# Patient Record
Sex: Female | Born: 1985 | Race: White | Hispanic: No | Marital: Married | Smoking: Never smoker
Health system: Southern US, Community
[De-identification: ages and names within clinical notes are randomized; demographics above are authoritative.]

## PROBLEM LIST (undated history)

## (undated) DIAGNOSIS — Z803 Family history of malignant neoplasm of breast: Secondary | ICD-10-CM

## (undated) DIAGNOSIS — G43909 Migraine, unspecified, not intractable, without status migrainosus: Secondary | ICD-10-CM

## (undated) DIAGNOSIS — R87629 Unspecified abnormal cytological findings in specimens from vagina: Secondary | ICD-10-CM

## (undated) HISTORY — DX: Unspecified abnormal cytological findings in specimens from vagina: R87.629

## (undated) HISTORY — DX: Migraine, unspecified, not intractable, without status migrainosus: G43.909

## (undated) HISTORY — DX: Family history of malignant neoplasm of breast: Z80.3

---

## 2009-10-04 ENCOUNTER — Ambulatory Visit: Payer: Self-pay | Admitting: Internal Medicine

## 2011-07-31 DIAGNOSIS — R87629 Unspecified abnormal cytological findings in specimens from vagina: Secondary | ICD-10-CM

## 2011-07-31 HISTORY — DX: Unspecified abnormal cytological findings in specimens from vagina: R87.629

## 2012-05-19 ENCOUNTER — Ambulatory Visit: Payer: Self-pay

## 2012-05-19 LAB — URINALYSIS, COMPLETE

## 2012-05-21 LAB — URINE CULTURE

## 2013-08-08 ENCOUNTER — Ambulatory Visit: Payer: Self-pay | Admitting: Family Medicine

## 2013-08-08 ENCOUNTER — Emergency Department: Payer: Self-pay | Admitting: Emergency Medicine

## 2013-08-08 LAB — CBC WITH DIFFERENTIAL/PLATELET
BASOS ABS: 0 10*3/uL (ref 0.0–0.1)
Basophil %: 0.5 %
EOS PCT: 1.1 %
Eosinophil #: 0.1 10*3/uL (ref 0.0–0.7)
HCT: 38.2 % (ref 35.0–47.0)
HGB: 13.3 g/dL (ref 12.0–16.0)
LYMPHS ABS: 1.4 10*3/uL (ref 1.0–3.6)
Lymphocyte %: 17.1 %
MCH: 29.5 pg (ref 26.0–34.0)
MCHC: 34.7 g/dL (ref 32.0–36.0)
MCV: 85 fL (ref 80–100)
MONOS PCT: 7.1 %
Monocyte #: 0.6 x10 3/mm (ref 0.2–0.9)
NEUTROS PCT: 74.2 %
Neutrophil #: 5.9 10*3/uL (ref 1.4–6.5)
PLATELETS: 174 10*3/uL (ref 150–440)
RBC: 4.5 10*6/uL (ref 3.80–5.20)
RDW: 13.9 % (ref 11.5–14.5)
WBC: 8 10*3/uL (ref 3.6–11.0)

## 2013-08-08 LAB — COMPREHENSIVE METABOLIC PANEL
ALBUMIN: 3.6 g/dL (ref 3.4–5.0)
ALK PHOS: 46 U/L
ALT: 17 U/L (ref 12–78)
Anion Gap: 4 — ABNORMAL LOW (ref 7–16)
BUN: 8 mg/dL (ref 7–18)
Bilirubin,Total: 1.1 mg/dL — ABNORMAL HIGH (ref 0.2–1.0)
Calcium, Total: 8.9 mg/dL (ref 8.5–10.1)
Chloride: 104 mmol/L (ref 98–107)
Co2: 26 mmol/L (ref 21–32)
Creatinine: 0.7 mg/dL (ref 0.60–1.30)
EGFR (African American): >60
GLUCOSE: 75 mg/dL (ref 65–99)
Osmolality: 265 (ref 275–301)
Potassium: 3.6 mmol/L (ref 3.5–5.1)
SGOT(AST): 26 U/L (ref 15–37)
Sodium: 134 mmol/L — ABNORMAL LOW (ref 136–145)
TOTAL PROTEIN: 7.5 g/dL (ref 6.4–8.2)

## 2013-08-08 LAB — URINALYSIS, COMPLETE
Bacteria: NEGATIVE
Blood: NEGATIVE
Glucose,UR: NEGATIVE mg/dL (ref 0–75)
Ketone: NEGATIVE
Leukocyte Esterase: NEGATIVE
Nitrite: NEGATIVE
PH: 6 (ref 4.5–8.0)
Specific Gravity: >=1.03 (ref 1.003–1.030)

## 2013-08-08 LAB — RAPID INFLUENZA A&B ANTIGENS

## 2013-08-10 LAB — URINE CULTURE

## 2013-09-26 ENCOUNTER — Ambulatory Visit: Payer: Self-pay | Admitting: Physician Assistant

## 2014-02-21 ENCOUNTER — Inpatient Hospital Stay: Payer: Self-pay

## 2014-02-21 LAB — CBC WITH DIFFERENTIAL/PLATELET
BASOS PCT: 1.1 %
Basophil #: 0.2 10*3/uL — ABNORMAL HIGH (ref 0.0–0.1)
EOS PCT: 0.9 %
Eosinophil #: 0.1 10*3/uL (ref 0.0–0.7)
HCT: 32.9 % — ABNORMAL LOW (ref 35.0–47.0)
HGB: 10.4 g/dL — ABNORMAL LOW (ref 12.0–16.0)
LYMPHS PCT: 12.1 %
Lymphocyte #: 1.7 10*3/uL (ref 1.0–3.6)
MCH: 25.4 pg — AB (ref 26.0–34.0)
MCHC: 31.8 g/dL — ABNORMAL LOW (ref 32.0–36.0)
MCV: 80 fL (ref 80–100)
MONO ABS: 0.9 x10 3/mm (ref 0.2–0.9)
MONOS PCT: 6.6 %
NEUTROS ABS: 11.1 10*3/uL — AB (ref 1.4–6.5)
Neutrophil %: 79.3 %
Platelet: 193 10*3/uL (ref 150–440)
RBC: 4.12 10*6/uL (ref 3.80–5.20)
RDW: 14.3 % (ref 11.5–14.5)
WBC: 14 10*3/uL — AB (ref 3.6–11.0)

## 2014-02-22 LAB — HEMATOCRIT: HCT: 32.5 % — ABNORMAL LOW (ref 35.0–47.0)

## 2014-08-11 LAB — HM PAP SMEAR

## 2014-09-03 HISTORY — PX: COLPOSCOPY: SHX161

## 2014-12-07 NOTE — H&P (Signed)
L&D Evaluation:  History:  HPI 29yo MWF poresents in active labor at 725w0d, with regular contractions and BOW intact. Denies concerns. PNC w/o complications.   Presents with contractions   Patient's Medical History No Chronic Illness   Patient's Surgical History none   Medications Pre Natal Vitamins  Iron  Tylenol (Acetaminophen)   Allergies NKDA   Social History none   Family History Non-Contributory   ROS:  ROS All systems were reviewed.  HEENT, CNS, GI, GU, Respiratory, CV, Renal and Musculoskeletal systems were found to be normal.   Exam:  Vital Signs stable   Urine Protein not completed   General no apparent distress   Mental Status clear   Chest clear   Heart normal sinus rhythm   Abdomen gravid, tender with contractions   Estimated Fetal Weight Average for gestational age   Fetal Position vtx   Edema no edema   Pelvic 7cm per RN exam   Mebranes Intact   FHT normal rate with no decels   Fetal Heart Rate 150   Ucx regular   Ucx Frequency 3 min   Length of each Contraction 60 seconds   Ucx Pain Scale 8   Impression:  Impression active labor   Plan:  Plan monitor contractions and for cervical change, epidural   Electronic Signatures: Ulyses AmorBurr, Arelyn Gauer N (CNM)  (Signed 26-Jul-15 17:13)  Authored: L&D Evaluation   Last Updated: 26-Jul-15 17:13 by Ulyses AmorBurr, Joyceann Kruser N (CNM)

## 2015-02-28 ENCOUNTER — Encounter: Payer: Self-pay | Admitting: *Deleted

## 2015-03-04 ENCOUNTER — Encounter: Payer: Self-pay | Admitting: Obstetrics and Gynecology

## 2015-03-04 ENCOUNTER — Ambulatory Visit (INDEPENDENT_AMBULATORY_CARE_PROVIDER_SITE_OTHER): Payer: BLUE CROSS/BLUE SHIELD | Admitting: Obstetrics and Gynecology

## 2015-03-04 VITALS — BP 113/84 | HR 99 | Ht 66.0 in | Wt 144.6 lb

## 2015-03-04 DIAGNOSIS — R896 Abnormal cytological findings in specimens from other organs, systems and tissues: Secondary | ICD-10-CM

## 2015-03-04 DIAGNOSIS — IMO0002 Reserved for concepts with insufficient information to code with codable children: Secondary | ICD-10-CM

## 2015-03-04 NOTE — Progress Notes (Signed)
Patient ID: LUDWIKA RODD, female   DOB: 08-Jun-1986, 29 y.o.   MRN: [id number]  Here for repeat pap only- denies any changes or concerns.  Previous pap in Jan 2016 showed LGSIL with colpo results the same.  O: A&O x4 Pelvic exam: normal external genitalia, vulva, vagina, cervix, uterus and adnexa  A Previous LGSIL on pap.  P: pap obtained.  RTC 6 months or PRN.  Quaniyah Bugh Ines Bloomer, CNM

## 2015-03-11 ENCOUNTER — Telehealth: Payer: Self-pay | Admitting: *Deleted

## 2015-03-11 ENCOUNTER — Encounter: Payer: Self-pay | Admitting: Obstetrics and Gynecology

## 2015-03-11 NOTE — Telephone Encounter (Signed)
-----   Message from Ulyses Amor, [place] sent at 03/11/2015  9:07 AM EDT ----- Please let her know pap and HPV were both negative

## 2015-03-11 NOTE — Telephone Encounter (Signed)
Notified pt of normal pap results, pt was very happy

## 2015-03-11 NOTE — Telephone Encounter (Signed)
-----   Message from Melody N Burr, CNM sent at 03/11/2015  9:07 AM EDT ----- Please let her know pap and HPV were both negative 

## 2015-08-11 ENCOUNTER — Ambulatory Visit: Payer: Self-pay | Admitting: Family Medicine

## 2015-08-16 ENCOUNTER — Encounter: Payer: Self-pay | Admitting: Obstetrics and Gynecology

## 2015-08-31 ENCOUNTER — Encounter: Payer: Self-pay | Admitting: Obstetrics and Gynecology

## 2015-08-31 ENCOUNTER — Ambulatory Visit (INDEPENDENT_AMBULATORY_CARE_PROVIDER_SITE_OTHER): Payer: BLUE CROSS/BLUE SHIELD | Admitting: Obstetrics and Gynecology

## 2015-08-31 ENCOUNTER — Other Ambulatory Visit: Payer: Self-pay | Admitting: Obstetrics and Gynecology

## 2015-08-31 VITALS — BP 112/80 | HR 84 | Ht 66.0 in | Wt 150.9 lb

## 2015-08-31 DIAGNOSIS — Z01419 Encounter for gynecological examination (general) (routine) without abnormal findings: Secondary | ICD-10-CM | POA: Diagnosis not present

## 2015-08-31 NOTE — Patient Instructions (Signed)
  Place annual gynecologic exam patient instructions here.  Thank you for enrolling in MyChart. Please follow the instructions below to securely access your online medical record. MyChart allows you to send messages to your doctor, view your test results, manage appointments, and more.   How Do I Sign Up? 1. In your Internet browser, go to [employer] and enter [email or web address]. 2. Click on the Sign Up Now link in the Sign In box. You will see the New Member Sign Up page. 3. Enter your MyChart Access Code exactly as it appears below. You will not need to use this code after you've completed the sign-up process. If you do not sign up before the expiration date, you must request a new code.  MyChart Access Code: [email or web address] Expires: 10/15/2015  8:17 AM  4. Enter your Social Security Number ([id number]) and Date of Birth (mm/dd/yyyy) as indicated and click Submit. You will be taken to the next sign-up page. 5. Create a MyChart ID. This will be your MyChart login ID and cannot be changed, so think of one that is secure and easy to remember. 6. Create a MyChart password. You can change your password at any time. 7. Enter your Password Reset Question and Answer. This can be used at a later time if you forget your password.  8. Enter your e-mail address. You will receive e-mail notification when new information is available in MyChart. 9. Click Sign Up. You can now view your medical record.   Additional Information Remember, MyChart is NOT to be used for urgent needs. For medical emergencies, dial 911.

## 2015-08-31 NOTE — Progress Notes (Signed)
Subjective:   Lori Kramer is a 30 y.o. G2P0 Caucasian female here for a routine well-woman exam.  Patient's last menstrual period was 08/14/2015.    Current complaints: none PCP: [facility]        Social History: Sexual: heterosexual Marital Status: married Living situation: with family Occupation: unknown occupation Tobacco/alcohol: no tobacco use Illicit drugs: no history of illicit drug use  The following portions of the patient's history were reviewed and updated as appropriate: allergies, current medications, past family history, past medical history, past social history, past surgical history and problem list.  Past Medical History Past Medical History  Diagnosis Date  . Vaginal Pap smear, abnormal 2013  . Migraine     Past Surgical History Past Surgical History  Procedure Laterality Date  . Colposcopy  Gynecologic History G2P0  Patient's last menstrual period was 08/14/2015. Contraception: coitus interruptus Last Pap: 2016. Results were: normal With h/o abnormal pap in past  Obstetric History OB History  Gravida Para Term Preterm AB SAB TAB Ectopic Multiple Living  2         2    # Outcome Date GA Lbr Len/2nd Weight Sex Delivery Anes PTL Lv  2 Gravida 02/21/14    F Vag-Spont   Y  1 Gravida 2009    M    Y      Current Medications No current outpatient prescriptions on file prior to visit.   No current facility-administered medications on file prior to visit.    Review of Systems Patient denies any headaches, blurred vision, shortness of breath, chest pain, abdominal pain, problems with bowel movements, urination, or intercourse.  Objective:  BP 112/80 mmHg  Pulse 84  Ht  (1.676 m)  Wt 150 lb 14.4 oz (68.448 kg)  BMI 24.37 kg/m2  LMP 08/14/2015  Breastfeeding? No Physical Exam  General:  Well developed, well nourished, no acute distress. She is alert and oriented x3. Skin:  Warm and dry Neck:  Midline trachea, no thyromegaly or  nodules Cardiovascular: Regular rate and rhythm, no murmur heard Lungs:  Effort normal, all lung fields clear to auscultation bilaterally Breasts:  No dominant palpable mass, retraction, or nipple discharge Abdomen:  Soft, non tender, no hepatosplenomegaly or masses Pelvic:  External genitalia is normal in appearance.  The vagina is normal in appearance. The cervix is bulbous, no CMT.  Thin prep pap is done with HR HPV cotesting. Uterus is felt to be normal size, shape, and contour.  No adnexal masses or tenderness noted. Extremities:  No swelling or varicosities noted Psych:  She has a normal mood and affect  Assessment:   Healthy well-woman exam H/o abnormal pap  Plan:  Pap obtained F/U 1 year for AE, or sooner if needed  Nathanal Hermiz Suzan Nailer, CNM

## 2015-09-01 LAB — CYTOLOGY - PAP

## 2015-09-02 ENCOUNTER — Telehealth: Payer: Self-pay | Admitting: *Deleted

## 2015-09-02 NOTE — Telephone Encounter (Signed)
Notified pt of results 

## 2015-09-02 NOTE — Telephone Encounter (Signed)
-----   Message from Purcell Nails, [place] sent at 09/01/2015  6:17 PM EST ----- Please let her know this pap was negative too

## 2016-02-08 ENCOUNTER — Emergency Department: Payer: BLUE CROSS/BLUE SHIELD

## 2016-02-08 ENCOUNTER — Encounter: Payer: Self-pay | Admitting: Emergency Medicine

## 2016-02-08 ENCOUNTER — Emergency Department
Admission: EM | Admit: 2016-02-08 | Discharge: 2016-02-08 | Disposition: A | Discharge status: Home / Self Care | Payer: BLUE CROSS/BLUE SHIELD | Attending: Emergency Medicine | Admitting: Emergency Medicine

## 2016-02-08 ENCOUNTER — Encounter: Payer: Self-pay | Admitting: *Deleted

## 2016-02-08 ENCOUNTER — Ambulatory Visit
Admission: EM | Admit: 2016-02-08 | Discharge: 2016-02-08 | Disposition: A | Discharge status: Other Acute Inpatient Hospital | Payer: BLUE CROSS/BLUE SHIELD | Attending: Family Medicine | Admitting: Family Medicine

## 2016-02-08 DIAGNOSIS — R51 Headache: Secondary | ICD-10-CM | POA: Diagnosis not present

## 2016-02-08 DIAGNOSIS — G4489 Other headache syndrome: Secondary | ICD-10-CM

## 2016-02-08 DIAGNOSIS — R519 Headache, unspecified: Secondary | ICD-10-CM

## 2016-02-08 MED ORDER — DIPHENHYDRAMINE HCL 25 MG PO CAPS
ORAL_CAPSULE | ORAL | Status: AC
  Administered 2016-02-08: 50 mg via ORAL
  Filled 2016-02-08: qty 2

## 2016-02-08 MED ORDER — DIPHENHYDRAMINE HCL 25 MG PO CAPS
50.0000 mg | ORAL_CAPSULE | Freq: Once | ORAL | Status: AC
  Administered 2016-02-08: 50 mg via ORAL

## 2016-02-08 MED ORDER — METOCLOPRAMIDE HCL 10 MG PO TABS
ORAL_TABLET | ORAL | Status: AC
  Administered 2016-02-08: 10 mg via ORAL
  Filled 2016-02-08: qty 1

## 2016-02-08 MED ORDER — BUTALBITAL-APAP-CAFFEINE 50-325-40 MG PO TABS
2.0000 | ORAL_TABLET | ORAL | Status: AC
  Administered 2016-02-08: 2 via ORAL
  Filled 2016-02-08: qty 2

## 2016-02-08 MED ORDER — BUTALBITAL-APAP-CAFFEINE 50-325-40 MG PO TABS: 1.0000 | ORAL_TABLET | Freq: Four times a day (QID) | ORAL | Status: AC | PRN

## 2016-02-08 MED ORDER — BUTALBITAL-APAP-CAFFEINE 50-325-40 MG PO TABS
ORAL_TABLET | ORAL | Status: AC
  Administered 2016-02-08: 2 via ORAL
  Filled 2016-02-08: qty 2

## 2016-02-08 MED ORDER — DIPHENHYDRAMINE HCL 25 MG PO CAPS: 50.0000 mg | ORAL_CAPSULE | Freq: Four times a day (QID) | ORAL | Status: DC | PRN

## 2016-02-08 MED ORDER — METOCLOPRAMIDE HCL 10 MG PO TABS: 10.0000 mg | ORAL_TABLET | Freq: Three times a day (TID) | ORAL | Status: DC

## 2016-02-08 MED ORDER — METOCLOPRAMIDE HCL 10 MG PO TABS
10.0000 mg | ORAL_TABLET | ORAL | Status: AC
  Administered 2016-02-08: 10 mg via ORAL
  Filled 2016-02-08: qty 1

## 2016-02-08 NOTE — ED Notes (Signed)
EDP at bedside  

## 2016-02-08 NOTE — ED Provider Notes (Signed)
[facility] Emergency Department Provider Note  ____________________________________________  Time seen: 3:50 PM  I have reviewed the triage vital signs and the nursing notes.   HISTORY  Chief Complaint Headache    HPI Lori Kramer is a 30 y.o. female who complains of gradual onset headache that started yesterday afternoon. Progressively worsening, hurts throughout her whole head per particularly feels like a severe pressure sensation in her face. Worse with lying down. Better if she sits up and closes her eyes. Tried taking Excedrin and Motrin without relief. Been associated with one or 2 episodes of vomiting as well. No diarrhea, no other symptoms or pain. No recent illness, no trauma. No neck stiffness or pain, photophobia, fevers chills or sweats.  Patient typically drank 2 or 3 caffeinated beverages a day, and she abruptly stopped 2 days ago.     Past Medical History  Diagnosis Date  . Vaginal Pap smear, abnormal 2013  . Migraine      There are no active problems to display for this patient.    Past Surgical History  Procedure Laterality Date  . Colposcopy  Current Outpatient Rx  Name  Route  Sig  Dispense  Refill  . cetirizine (ZYRTEC) 10 MG tablet   Oral   Take 10 mg by mouth daily.         . butalbital-acetaminophen-caffeine (FIORICET) 50-325-40 MG tablet   Oral   Take 1-2 tablets by mouth every 6 (six) hours as needed for headache. Keep in a secure place, out of reach of pets and children.   12 tablet   0   . diphenhydrAMINE (BENADRYL) 25 mg capsule   Oral   Take 2 capsules (50 mg total) by mouth every 6 (six) hours as needed.   60 capsule   0   . metoCLOPramide (REGLAN) 10 MG tablet   Oral   Take 1 tablet (10 mg total) by mouth 4 (four) times daily -  before meals and at bedtime.   60 tablet   0      Allergies Review of patient's allergies indicates no known allergies.   Family History  Problem  Relation Age of Onset  . Cancer Maternal Aunt     breast    Social History Social History  Substance Use Topics  . Smoking status: Never Smoker   . Smokeless tobacco: Never Used  . Alcohol Use: Yes     Comment: occas    Review of Systems  Constitutional:   No fever or chills.  ENT:   No sore throat. No rhinorrhea. Cardiovascular:   No chest pain. Respiratory:   No dyspnea or cough. Gastrointestinal:   Negative for abdominal pain, Positive vomiting today.  Genitourinary:   Negative for dysuria or difficulty urinating. Musculoskeletal:   Negative for focal pain or swelling Neurological:   Positive headache as above 10-point ROS otherwise negative.  ____________________________________________   PHYSICAL EXAM:  VITAL SIGNS: ED Triage Vitals  Enc Vitals Group     BP 02/08/16 1509 119/77 mmHg     Pulse Rate 02/08/16 1509 97     Resp 02/08/16 1509 18     Temp --      Temp Source 02/08/16 1509 Oral     SpO2 02/08/16 1509 100 %     Weight --      Height --      Head Cir --      Peak Flow --  Pain Score 02/08/16 1509 9     Pain Loc --      Pain Edu? --      Excl. in GC? --     Vital signs reviewed, nursing assessments reviewed.   Constitutional:   Alert and oriented. Well appearing and in no distress. Eyes:   No scleral icterus. No conjunctival pallor. PERRL. EOMI.  No nystagmus. ENT   Head:   Normocephalic and atraumatic. TMs normal. No pain with percussion of sinuses   Nose:   No congestion/rhinnorhea. No septal hematoma   Mouth/Throat:   MMM, no pharyngeal erythema. No peritonsillar mass.    Neck:   No stridor. No SubQ emphysema. No meningismus. Hematological/Lymphatic/Immunilogical:   No cervical lymphadenopathy. Cardiovascular:   RRR. Symmetric bilateral radial and DP pulses.  No murmurs.  Respiratory:   Normal respiratory effort without tachypnea nor retractions. Breath sounds are clear and equal bilaterally. No  wheezes/rales/rhonchi.  Neurologic:   Normal speech and language.  CN 2-10 normal. Motor grossly intact. No gross focal neurologic deficits are appreciated.    ____________________________________________    LABS (pertinent positives/negatives) (all labs ordered are listed, but only abnormal results are displayed) Labs Reviewed - No data to display ____________________________________________   EKG    ____________________________________________    RADIOLOGY  CT head unremarkable  ____________________________________________   PROCEDURES   ____________________________________________   INITIAL IMPRESSION / ASSESSMENT AND PLAN / ED COURSE  Pertinent labs & imaging results that were available during my care of the patient were reviewed by me and considered in my medical decision making (see chart for details).  Patient very well-appearing no acute distress. By history her symptoms are very consistent with caffeine withdrawal headache. However, because of the positional nature and the vomiting, this is somewhat atypical and I want to get a CT scan of her head. We'll also have her drink coffee for caffeine replacement and reassess her symptoms. I think if the CT head does not show any structural abnormalities, she is stable for discharge home and have low suspicion for stroke intracranial hemorrhage meningitis encephalitis or intracranial hypertension.  ----------------------------------------- 6:15 PM on 02/08/2016 -----------------------------------------  Vital signs remain normal. Patient ambulatory. No meningismus. Was able to drink about 8 ounces of coffee, which is less than she usually drinks at a time as far as caffeine dosage. Think very likely this is caffeine withdrawal, she is otherwise very well-appearing. I'll give her Reglan and Benadryl and Fioricet. Follow up with primary care. Return precautions  given.     ____________________________________________   FINAL CLINICAL IMPRESSION(S) / ED DIAGNOSES  Final diagnoses:  Acute intractable headache, unspecified headache type       Portions of this note were generated with dragon dictation software. Dictation errors may occur despite best attempts at proofreading.   Sharman CheekPhillip Kjuan Seipp, MD 02/08/16 [phone]

## 2016-02-08 NOTE — ED Notes (Signed)
Pt given coffee. °

## 2016-02-08 NOTE — ED Notes (Addendum)
Pt presents with headache that started yesterday and had continued on today with no relief from OTC meds. Also with n/v.  Pt stopped all caffeine and sugar intake on Monday.

## 2016-02-08 NOTE — Discharge Instructions (Signed)
You were prescribed a medication that is potentially sedating. Do not drink alcohol, drive or participate in any other potentially dangerous activities while taking this medication as it may make you sleepy. Do not take this medication with any other sedating medications, either prescription or over-the-counter. Do not take this medication with acetaminophen (Tylenol) as it is already contained within the Fioricet.    General Headache Without Cause A headache is pain or discomfort felt around the head or neck area. The specific cause of a headache may not be found. There are many causes and types of headaches. A few common ones are:  Tension headaches.  Migraine headaches.  Cluster headaches.  Chronic daily headaches. HOME CARE INSTRUCTIONS  Watch your condition for any changes. Take these steps to help with your condition: Managing Pain  Take over-the-counter and prescription medicines only as told by your health care provider.  Lie down in a dark, quiet room when you have a headache.  If directed, apply ice to the head and neck area:  Put ice in a plastic bag.  Place a towel between your skin and the bag.  Leave the ice on for 20 minutes, 2-3 times per day.  Use a heating pad or hot shower to apply heat to the head and neck area as told by your health care provider.  Keep lights dim if bright lights bother you or make your headaches worse. Eating and Drinking  Eat meals on a regular schedule.  Limit alcohol use.  Decrease the amount of caffeine you drink, or stop drinking caffeine. General Instructions  Keep all follow-up visits as told by your health care provider. This is important.  Keep a headache journal to help find out what may trigger your headaches. For example, write down:  What you eat and drink.  How much sleep you get.  Any change to your diet or medicines.  Try massage or other relaxation techniques.  Limit stress.  Sit up straight, and do not  tense your muscles.  Do not use tobacco products, including cigarettes, chewing tobacco, or e-cigarettes. If you need help quitting, ask your health care provider.  Exercise regularly as told by your health care provider.  Sleep on a regular schedule. Get 7-9 hours of sleep, or the amount recommended by your health care provider. SEEK MEDICAL CARE IF:   Your symptoms are not helped by medicine.  You have a headache that is different from the usual headache.  You have nausea or you vomit.  You have a fever. SEEK IMMEDIATE MEDICAL CARE IF:   Your headache becomes severe.  You have repeated vomiting.  You have a stiff neck.  You have a loss of vision.  You have problems with speech.  You have pain in the eye or ear.  You have muscular weakness or loss of muscle control.  You lose your balance or have trouble walking.  You feel faint or pass out.  You have confusion.   This information is not intended to replace advice given to you by your health care provider. Make sure you discuss any questions you have with your health care provider.   Document Released: 07/16/2005 Document Revised: 04/06/2015 Document Reviewed: 11/08/2014 Elsevier Interactive Patient Education [employer]2016 Elsevier Inc.

## 2016-02-08 NOTE — ED Provider Notes (Signed)
CSN: [id number]     Arrival date & time 02/08/16  1324 History   First MD Initiated Contact with Patient 02/08/16 1358     Chief Complaint  Patient presents with  . Headache  . Nausea   (Consider location/radiation/quality/duration/timing/severity/associated sxs/prior Treatment) HPI: H and presents today with symptoms of severe headache. Patient states that the symptoms started yesterday morning. She states that she has taken Excedrin and Motrin and has not helped the headache at all. She denies any history of migraines. She has had nausea and minimal vomiting but no diarrhea. She states she just finished her menstrual cycle a few days ago. She denies any other upper respiratory symptoms including current sinus issues. She denies any neck stiffness, photophobia, abdominal pain or urinary symptoms. Lights and noise do not particularly change the level of her headache. She does state that she cut out caffeine and sugar on Monday to improve her diet. She did do a workout at the gym with weights on Monday but states this is no different than usual. She did have a caffeinated beverage but did not help her symptoms yesterday. She denies any head trauma. She states that this is a 10 out of 10 headache that is not decreasing with anything she tries. Difficulty sleeping at night.  Past Medical History  Diagnosis Date  . Vaginal Pap smear, abnormal 2013  . Migraine    Past Surgical History  Procedure Laterality Date  . Colposcopy  2 5 16    Family History  Problem Relation Age of Onset  . Cancer Maternal Aunt     breast   Social History  Substance Use Topics  . Smoking status: Never Smoker   . Smokeless tobacco: Never Used  . Alcohol Use: Yes     Comment: occas   OB History    Gravida Para Term Preterm AB TAB SAB Ectopic Multiple Living   2         2     Review of Systems: negative except mentioned above.   Allergies  Review of patient's allergies indicates no known allergies.  Home  Medications   Prior to Admission medications   Medication Sig Start Date End Date Taking? Authorizing Provider  cetirizine (ZYRTEC) 10 MG tablet Take 10 mg by mouth daily.   Yes Historical Provider, MD   Meds Ordered and Administered this Visit  Medications - No data to display  BP 119/78 mmHg  Pulse 93  Temp(Src) 98.5 F (36.9 C) (Oral)  Resp 18  Ht 5\' 6"  (1.676 m)  Wt 150 lb (68.04 kg)  BMI 24.22 kg/m2  SpO2 100%  LMP 02/06/2016  Breastfeeding? No No data found.   Physical Exam   GENERAL: appears to be in discomfort when talking  HEENT: no pharyngeal erythema, no exudate RESP: CTA B CARD: RRR NEURO: CN II-XII grossly intact   ED Course  Procedures (including critical care time)  Labs Review Labs Reviewed - No data to display  Imaging Review No results found.     MDM  A/P: Severe headache- given patient's description above, would recommend that she have further evaluation and treatment at [facility]. She has someone to take her there now.    Jolene ProvostKirtida Ijeoma Loor, MD 02/08/16 [phone]

## 2016-02-08 NOTE — ED Notes (Signed)
Patient started having headache symptoms yesterday AM. OTC medication has not resolved symptom. Nausea began today with minor vomiting and no diarrhea. Patient does not have history a of migraines.

## 2016-09-04 ENCOUNTER — Encounter: Payer: BLUE CROSS/BLUE SHIELD | Admitting: Obstetrics and Gynecology

## 2017-03-29 ENCOUNTER — Encounter: Payer: BLUE CROSS/BLUE SHIELD | Admitting: Obstetrics and Gynecology

## 2017-04-09 ENCOUNTER — Other Ambulatory Visit: Payer: Self-pay | Admitting: Obstetrics and Gynecology

## 2017-04-09 ENCOUNTER — Encounter: Payer: Self-pay | Admitting: Obstetrics and Gynecology

## 2017-04-09 ENCOUNTER — Ambulatory Visit (INDEPENDENT_AMBULATORY_CARE_PROVIDER_SITE_OTHER): Payer: 59 | Admitting: Obstetrics and Gynecology

## 2017-04-09 VITALS — BP 109/67 | HR 93 | Ht 66.0 in | Wt 149.6 lb

## 2017-04-09 DIAGNOSIS — Z01419 Encounter for gynecological examination (general) (routine) without abnormal findings: Secondary | ICD-10-CM | POA: Diagnosis not present

## 2017-04-09 NOTE — Progress Notes (Signed)
Subjective:   Lori Kramer is a 31 y.o. G2P0 Caucasian female here for a routine well-woman exam.  Patient's last menstrual period was 03/24/2017.  Running 2-3 x week and power hot yoga 2 times a week.  Current complaints: none PCP: Burnett ShengHedrick       does desire labs  Social History: Sexual: heterosexual Marital Status: married Living situation: with family Occupation: Chemical engineerT director at [employer] Tobacco/alcohol: no tobacco use Illicit drugs: no history of illicit drug use  The following portions of the patient's history were reviewed and updated as appropriate: allergies, current medications, past family history, past medical history, past social history, past surgical history and problem list.  Past Medical History Past Medical History:  Diagnosis Date  . Migraine   . Vaginal Pap smear, abnormal 2013    Past Surgical History Past Surgical History:  Procedure Laterality Date  . COLPOSCOPY  2 5 16     Gynecologic History G2P0  Patient's last menstrual period was 03/24/2017. Contraception: coitus interruptus Last Pap: 2017. Results were: normal   Obstetric History OB History  Gravida Para Term Preterm AB Living  2         2  SAB TAB Ectopic Multiple Live Births          2    # Outcome Date GA Lbr Len/2nd Weight Sex Delivery Anes PTL Lv  2 Gravida 02/21/14    F Vag-Spont   LIV  1 Gravida 2009    M    LIV      Current Medications No current outpatient prescriptions on file prior to visit.   No current facility-administered medications on file prior to visit.     Review of Systems Patient denies any headaches, blurred vision, shortness of breath, chest pain, abdominal pain, problems with bowel movements, urination, or intercourse.  Objective:  BP 109/67   Pulse 93   Ht 5\' 6"  (1.676 m)   Wt 149 lb 9.6 oz (67.9 kg)   LMP 03/24/2017   BMI 24.15 kg/m  Physical Exam  General:  Well developed, well nourished, no acute distress. She is alert and oriented  x3. Skin:  Warm and dry Neck:  Midline trachea, no thyromegaly or nodules Cardiovascular: Regular rate and rhythm, no murmur heard Lungs:  Effort normal, all lung fields clear to auscultation bilaterally Breasts:  No dominant palpable mass, retraction, or nipple discharge Abdomen:  Soft, non tender, no hepatosplenomegaly or masses Pelvic:  External genitalia is normal in appearance.  The vagina is normal in appearance. The cervix is bulbous, no CMT.  Thin prep pap is done with HR HPV cotesting. Uterus is felt to be normal size, shape, and contour.  No adnexal masses or tenderness noted. Extremities:  No swelling or varicosities noted Psych:  She has a normal mood and affect  Assessment:   Healthy well-woman exam  Plan:  Labs obtained and will follow up accordingly. F/U 1 year for AE, or sooner if needed   Jayshon Dommer Suzan NailerN Tanairy Payeur, CNM

## 2017-04-09 NOTE — Patient Instructions (Addendum)
Preventive Care 18-39 Years, Female Preventive care refers to lifestyle choices and visits with your health care provider that can promote health and wellness. What does preventive care include?  A yearly physical exam. This is also called an annual well check.  Dental exams once or twice a year.  Routine eye exams. Ask your health care provider how often you should have your eyes checked.  Personal lifestyle choices, including: ? Daily care of your teeth and gums. ? Regular physical activity. ? Eating a healthy diet. ? Avoiding tobacco and drug use. ? Limiting alcohol use. ? Practicing safe sex. ? Taking vitamin and mineral supplements as recommended by your health care provider. What happens during an annual well check? The services and screenings done by your health care provider during your annual well check will depend on your age, overall health, lifestyle risk factors, and family history of disease. Counseling Your health care provider may ask you questions about your:  Alcohol use.  Tobacco use.  Drug use.  Emotional well-being.  Home and relationship well-being.  Sexual activity.  Eating habits.  Work and work Statistician.  Method of birth control.  Menstrual cycle.  Pregnancy history.  Screening You may have the following tests or measurements:  Height, weight, and BMI.  Diabetes screening. This is done by checking your blood sugar (glucose) after you have not eaten for a while (fasting).  Blood pressure.  Lipid and cholesterol levels. These may be checked every 5 years starting at age 33.  Skin check.  Hepatitis C blood test.  Hepatitis B blood test.  Sexually transmitted disease (STD) testing.  BRCA-related cancer screening. This may be done if you have a family history of breast, ovarian, tubal, or peritoneal cancers.  Pelvic exam and Pap test. This may be done every 3 years starting at age 33. Starting at age 30, this may be done  every 5 years if you have a Pap test in combination with an HPV test.  Discuss your test results, treatment options, and if necessary, the need for more tests with your health care provider. Vaccines Your health care provider may recommend certain vaccines, such as:  Influenza vaccine. This is recommended every year.  Tetanus, diphtheria, and acellular pertussis (Tdap, Td) vaccine. You may need a Td booster every 10 years.  Varicella vaccine. You may need this if you have not been vaccinated.  HPV vaccine. If you are 39 or younger, you may need three doses over 6 months.  Measles, mumps, and rubella (MMR) vaccine. You may need at least one dose of MMR. You may also need a second dose.  Pneumococcal 13-valent conjugate (PCV13) vaccine. You may need this if you have certain conditions and were not previously vaccinated.  Pneumococcal polysaccharide (PPSV23) vaccine. You may need one or two doses if you smoke cigarettes or if you have certain conditions.  Meningococcal vaccine. One dose is recommended if you are age 68-21 years and a first-year college student living in a residence hall, or if you have one of several medical conditions. You may also need additional booster doses.  Hepatitis A vaccine. You may need this if you have certain conditions or if you travel or work in places where you may be exposed to hepatitis A.  Hepatitis B vaccine. You may need this if you have certain conditions or if you travel or work in places where you may be exposed to hepatitis B.  Haemophilus influenzae type b (Hib) vaccine. You may need this  if you have certain risk factors.  Talk to your health care provider about which screenings and vaccines you need and how often you need them. This information is not intended to replace advice given to you by your health care provider. Make sure you discuss any questions you have with your health care provider. Document Released: 09/11/2001 Document Revised:  04/04/2016 Document Reviewed: 05/17/2015 Elsevier Interactive Patient Education  2017 [employer] you for enrolling in [place]. Please follow the instructions below to securely access your online medical record. MyChart allows you to send messages to your doctor, view your test results, renew your prescriptions, schedule appointments, and more.  How Do I Sign Up? 1. In your Internet browser, go to [email or web address]. 2. Click on the New  User? link in the Sign In box.  3. Enter your MyChart Access Code exactly as it appears below. You will not need to use this code after you have completed the sign-up process. If you do not sign up before the expiration date, you must request a new code. MyChart Access Code: [email or web address] Expires: 05/24/2017  8:40 AM  4. Enter the last four digits of your Social Security Number (xxxx) and Date of Birth (mm/dd/yyyy) as indicated and click Next. You will be taken to the next sign-up page. 5. Create a MyChart ID. This will be your MyChart login ID and cannot be changed, so think of one that is secure and easy to remember. 6. Create a MyChart password. You can change your password at any time. 7. Enter your Password Reset Question and Answer and click Next. This can be used at a later time if you forget your password.  8. Select your communication preference, and if applicable enter your e-mail address. You will receive e-mail notification when new information is available in MyChart by choosing to receive e-mail notifications and filling in your e-mail. 9. Click Sign In. You can now view your medical record.   Additional Information If you have questions, you can email [email or web address]  [email or web address] or call [phone] to talk to our [place] staff. Remember, MyChart is NOT to be used for urgent needs. For medical emergencies, dial 911.

## 2017-04-10 LAB — COMPREHENSIVE METABOLIC PANEL
ALK PHOS: 52 IU/L (ref 39–117)
ALT: 10 IU/L (ref 0–32)
AST: 24 IU/L (ref 0–40)
Albumin/Globulin Ratio: 1.6 (ref 1.2–2.2)
Albumin: 4.2 g/dL (ref 3.5–5.5)
BUN / CREAT RATIO: 8 — AB (ref 9–23)
BUN: 10 mg/dL (ref 6–20)
Bilirubin Total: 1.2 mg/dL (ref 0.0–1.2)
CHLORIDE: 105 mmol/L (ref 96–106)
CO2: 23 mmol/L (ref 20–29)
CREATININE: 1.21 mg/dL — AB (ref 0.57–1.00)
Calcium: 9.6 mg/dL (ref 8.7–10.2)
GFR calc Af Amer: 68 mL/min/{1.73_m2} (ref >59)
GFR, EST NON AFRICAN AMERICAN: 59 mL/min/{1.73_m2} — AB (ref >59)
GLOBULIN, TOTAL: 2.7 g/dL (ref 1.5–4.5)
Glucose: 81 mg/dL (ref 65–99)
POTASSIUM: 5.1 mmol/L (ref 3.5–5.2)
Sodium: 144 mmol/L (ref 134–144)
Total Protein: 6.9 g/dL (ref 6.0–8.5)

## 2017-04-10 LAB — CBC
HEMATOCRIT: 40.9 % (ref 34.0–46.6)
HEMOGLOBIN: 12.9 g/dL (ref 11.1–15.9)
MCH: 28.7 pg (ref 26.6–33.0)
MCHC: 31.5 g/dL (ref 31.5–35.7)
MCV: 91 fL (ref 79–97)
Platelets: 200 10*3/uL (ref 150–379)
RBC: 4.5 x10E6/uL (ref 3.77–5.28)
RDW: 14.2 % (ref 12.3–15.4)
WBC: 4.6 10*3/uL (ref 3.4–10.8)

## 2017-04-10 LAB — LIPID PANEL
CHOL/HDL RATIO: 2 ratio (ref 0.0–4.4)
CHOLESTEROL TOTAL: 142 mg/dL (ref 100–199)
HDL: 72 mg/dL (ref >39)
LDL Calculated: 62 mg/dL (ref 0–99)
Triglycerides: 42 mg/dL (ref 0–149)
VLDL CHOLESTEROL CAL: 8 mg/dL (ref 5–40)

## 2017-04-10 LAB — CYTOLOGY - PAP

## 2018-01-15 ENCOUNTER — Other Ambulatory Visit: Payer: Self-pay

## 2018-01-15 ENCOUNTER — Encounter: Payer: Self-pay | Admitting: Emergency Medicine

## 2018-01-15 ENCOUNTER — Ambulatory Visit
Admission: EM | Admit: 2018-01-15 | Discharge: 2018-01-15 | Disposition: A | Discharge status: Home / Self Care | Payer: Managed Care, Other (non HMO) | Attending: Family Medicine | Admitting: Family Medicine

## 2018-01-15 DIAGNOSIS — J0100 Acute maxillary sinusitis, unspecified: Secondary | ICD-10-CM | POA: Diagnosis not present

## 2018-01-15 MED ORDER — AMOXICILLIN-POT CLAVULANATE 875-125 MG PO TABS: 1.0000 | ORAL_TABLET | Freq: Two times a day (BID) | ORAL | 0 refills | Status: DC

## 2018-01-15 NOTE — ED Provider Notes (Signed)
[facility] ____________________________________________  Time seen: Approximately 8:57 AM  I have reviewed the triage vital signs and the nursing notes.   HISTORY  Chief Complaint Facial Pain   HPI Lori Kramer is a 32 y.o. female presenting for evaluation of sinus pressure and sinus congestion present for the last 2.5 weeks.  Patient reports does have seasonal allergies and usually takes Zyrtec daily.  Patient reports sinus congestion has been constant particularly to forehead and right maxillary area.  States pressure and congestion.  States thick nasal drainage out.  Also reports using Nettie pot twice a day with brief improvement, no resolution.  Was treated with oral amoxicillin which she completed yesterday, and states that it did slightly help but no resolution in symptoms or worsening over the last few days.  Intermittent cough.  No chest pain or shortness of breath or chest congestion.  Denies fevers.  Continues to eat and drink well.  Reports otherwise feels well.  Has also been taken some intermittent over-the-counter decongestants.  Denies other aggravating alleviating factors.  Patient's last menstrual period was 12/25/2017.  Patient states that she does not believe she is pregnant.  Jerl MinaHedrick, James, MD: PCP    Past Medical History:  Diagnosis Date  . Migraine   . Vaginal Pap smear, abnormal 2013    There are no active problems to display for this patient.   Past Surgical History:  Procedure Laterality Date  . COLPOSCOPY  2 5 16      No current facility-administered medications for this encounter.   Current Outpatient Medications:  .  cetirizine (ZYRTEC) 10 MG tablet, Take 10 mg by mouth daily., Disp: , Rfl:  .  amoxicillin-clavulanate (AUGMENTIN) 875-125 MG tablet, Take 1 tablet by mouth every 12 (twelve) hours., Disp: 20 tablet, Rfl: 0  Allergies Patient has no known allergies.  Family History  Problem Relation Age of Onset  . Cancer  Maternal Aunt        breast  . Healthy Mother   . Healthy Father     Social History Social History   Tobacco Use  . Smoking status: Never Smoker  . Smokeless tobacco: Never Used  Substance Use Topics  . Alcohol use: Yes    Comment: occas  . Drug use: No    Review of Systems Constitutional: No fever ENT: No sore throat. Cardiovascular: Denies chest pain. Respiratory: Denies shortness of breath. Gastrointestinal: No abdominal pain.   Musculoskeletal: Negative for back pain. Skin: Negative for rash.   ____________________________________________   PHYSICAL EXAM:  VITAL SIGNS: ED Triage Vitals  Enc Vitals Group     BP 01/15/18 0855 105/73     Pulse Rate 01/15/18 0855 67     Resp 01/15/18 0855 18     Temp 01/15/18 0855 98.2 F (36.8 C)     Temp Source 01/15/18 0855 Oral     SpO2 01/15/18 0855 100 %     Weight 01/15/18 0853 150 lb (68 kg)     Height 01/15/18 0853 5\' 6"  (1.676 m)     Head Circumference --      Peak Flow --      Pain Score 01/15/18 0853 6     Pain Loc --      Pain Edu? --      Excl. in GC? --     Constitutional: Alert and oriented. Well appearing and in no acute distress. Eyes: Conjunctivae are normal.  Head: Atraumatic.Mild to moderate tenderness to palpation bilateral frontal and left  maxillary sinuses, right maxillary sinus tenderness palpation.. No swelling. No erythema.   Ears: no erythema, normal TMs bilaterally.   Nose: nasal congestion with bilateral nasal turbinate erythema and edema.   Mouth/Throat: Mucous membranes are moist.  Oropharynx non-erythematous.No tonsillar swelling or exudate.  Neck: No stridor.  No cervical spine tenderness to palpation. Hematological/Lymphatic/Immunilogical: No cervical lymphadenopathy. Cardiovascular: Normal rate, regular rhythm. Grossly normal heart sounds.  Good peripheral circulation. Respiratory: Normal respiratory effort.  No retractions.No wheezes, rales or rhonchi. Good air movement.    Musculoskeletal: No cervical, thoracic or lumbar tenderness to palpation.  Neurologic:  Normal speech and language. No gross focal neurologic deficits are appreciated. No gait instability. Skin:  Skin is warm, dry and intact. No rash noted. Psychiatric: Mood and affect are normal. Speech and behavior are normal.  ___________________________________________   LABS (all labs ordered are listed, but only abnormal results are displayed)  Labs Reviewed - No data to display  PROCEDURES Procedures    INITIAL IMPRESSION / ASSESSMENT AND PLAN / ED COURSE  Pertinent labs & imaging results that were available during my care of the patient were reviewed by me and considered in my medical decision making (see chart for details).  Well-appearing patient.  No acute distress.  Suspect continued sinusitis.  Patient states that she was treated with plain amoxicillin.  Discussed use of doxycycline, however patient then states she does not think she is pregnant but would prefer to do an antibiotic that is safe during pregnancy.  Will treat with oral Augmentin.  Discussed changing antihistamines for support.  Encouraged rest, fluids, supportive care.  Recommend follow-up with ENT.Discussed indication, risks and benefits of medications with patient.  Discussed follow up with Primary care physician this week. Discussed follow up and return parameters including no resolution or any worsening concerns. Patient verbalized understanding and agreed to plan.   ____________________________________________   FINAL CLINICAL IMPRESSION(S) / ED DIAGNOSES  Final diagnoses:  Acute maxillary sinusitis, recurrence not specified     ED Discharge Orders        Ordered    amoxicillin-clavulanate (AUGMENTIN) 875-125 MG tablet  Every 12 hours     01/15/18 0908       Note: This dictation was prepared with Dragon dictation along with smaller phrase technology. Any transcriptional errors that result from this process  are unintentional.     ,   Renford Dills, NP 01/15/18 [phone]

## 2018-01-15 NOTE — ED Triage Notes (Signed)
Patient c/o sinus pressure and pain x 2 weeks. Started nasal decongestant, Claritin, Afrin. Symptoms cleared up and then returned. She was seen in urgent care last week for same symptoms and was started on Amoxicillin for 1 week. Continues with right ear pain and sinus pain and pressure.

## 2018-01-15 NOTE — Discharge Instructions (Addendum)
Take medication as prescribed. Rest. Drink plenty of fluids.  ° °Follow up with your primary care physician this week as needed. Return to Urgent care for new or worsening concerns.  ° °

## 2018-04-10 ENCOUNTER — Ambulatory Visit (INDEPENDENT_AMBULATORY_CARE_PROVIDER_SITE_OTHER): Payer: Managed Care, Other (non HMO) | Admitting: Obstetrics and Gynecology

## 2018-04-10 ENCOUNTER — Encounter: Payer: Self-pay | Admitting: Obstetrics and Gynecology

## 2018-04-10 VITALS — BP 130/84 | HR 84 | Ht 66.0 in | Wt 155.3 lb

## 2018-04-10 DIAGNOSIS — Z01419 Encounter for gynecological examination (general) (routine) without abnormal findings: Secondary | ICD-10-CM | POA: Diagnosis not present

## 2018-04-10 NOTE — Patient Instructions (Signed)
Preventive Care 18-39 Years, Female Preventive care refers to lifestyle choices and visits with your health care provider that can promote health and wellness. What does preventive care include?  A yearly physical exam. This is also called an annual well check.  Dental exams once or twice a year.  Routine eye exams. Ask your health care provider how often you should have your eyes checked.  Personal lifestyle choices, including: ? Daily care of your teeth and gums. ? Regular physical activity. ? Eating a healthy diet. ? Avoiding tobacco and drug use. ? Limiting alcohol use. ? Practicing safe sex. ? Taking vitamin and mineral supplements as recommended by your health care provider. What happens during an annual well check? The services and screenings done by your health care provider during your annual well check will depend on your age, overall health, lifestyle risk factors, and family history of disease. Counseling Your health care provider may ask you questions about your:  Alcohol use.  Tobacco use.  Drug use.  Emotional well-being.  Home and relationship well-being.  Sexual activity.  Eating habits.  Work and work Statistician.  Method of birth control.  Menstrual cycle.  Pregnancy history.  Screening You may have the following tests or measurements:  Height, weight, and BMI.  Diabetes screening. This is done by checking your blood sugar (glucose) after you have not eaten for a while (fasting).  Blood pressure.  Lipid and cholesterol levels. These may be checked every 5 years starting at age 38.  Skin check.  Hepatitis C blood test.  Hepatitis B blood test.  Sexually transmitted disease (STD) testing.  BRCA-related cancer screening. This may be done if you have a family history of breast, ovarian, tubal, or peritoneal cancers.  Pelvic exam and Pap test. This may be done every 3 years starting at age 38. Starting at age 30, this may be done  every 5 years if you have a Pap test in combination with an HPV test.  Discuss your test results, treatment options, and if necessary, the need for more tests with your health care provider. Vaccines Your health care provider may recommend certain vaccines, such as:  Influenza vaccine. This is recommended every year.  Tetanus, diphtheria, and acellular pertussis (Tdap, Td) vaccine. You may need a Td booster every 10 years.  Varicella vaccine. You may need this if you have not been vaccinated.  HPV vaccine. If you are 39 or younger, you may need three doses over 6 months.  Measles, mumps, and rubella (MMR) vaccine. You may need at least one dose of MMR. You may also need a second dose.  Pneumococcal 13-valent conjugate (PCV13) vaccine. You may need this if you have certain conditions and were not previously vaccinated.  Pneumococcal polysaccharide (PPSV23) vaccine. You may need one or two doses if you smoke cigarettes or if you have certain conditions.  Meningococcal vaccine. One dose is recommended if you are age 68-21 years and a first-year college student living in a residence hall, or if you have one of several medical conditions. You may also need additional booster doses.  Hepatitis A vaccine. You may need this if you have certain conditions or if you travel or work in places where you may be exposed to hepatitis A.  Hepatitis B vaccine. You may need this if you have certain conditions or if you travel or work in places where you may be exposed to hepatitis B.  Haemophilus influenzae type b (Hib) vaccine. You may need this  if you have certain risk factors.  Talk to your health care provider about which screenings and vaccines you need and how often you need them. This information is not intended to replace advice given to you by your health care provider. Make sure you discuss any questions you have with your health care provider. Document Released: 09/11/2001 Document Revised:  04/04/2016 Document Reviewed: 05/17/2015 Elsevier Interactive Patient Education  2018 Elsevier Inc.  

## 2018-04-10 NOTE — Progress Notes (Signed)
Subjective:   Lori Kramer is a 32 y.o. G2P0 Caucasian female here for a routine well-woman exam.  Patient's last menstrual period was 03/27/2018.    Current complaints: mild fatigue MID-DAY, CHANGED DIET AND CONTINUES EXERCISE PCP: Burnett ShengHedrick       does desire labs  Social History: Sexual: heterosexual Marital Status: married Living situation: with family Occupation: Chemical engineerT director at [employer] Tobacco/alcohol: no tobacco use Illicit drugs: no history of illicit drug use  The following portions of the patient's history were reviewed and updated as appropriate: allergies, current medications, past family history, past medical history, past social history, past surgical history and problem list.  Past Medical History Past Medical History:  Diagnosis Date  . Migraine   . Vaginal Pap smear, abnormal 2013    Past Surgical History Past Surgical History:  Procedure Laterality Date  . COLPOSCOPY  2 5 16     Gynecologic History G2P0  Patient's last menstrual period was 03/27/2018. Contraception: coitus interruptus Last Pap: 2018. Results were: normal   Obstetric History OB History  Gravida Para Term Preterm AB Living  2         2  SAB TAB Ectopic Multiple Live Births          2    # Outcome Date GA Lbr Len/2nd Weight Sex Delivery Anes PTL Lv  2 Gravida 02/21/14    F Vag-Spont   LIV  1 Gravida 2009    M    LIV    Current Medications Current Outpatient Medications on File Prior to Visit  Medication Sig Dispense Refill  . fexofenadine (ALLEGRA) 180 MG tablet Take 180 mg by mouth daily.    Marland Kitchen. amoxicillin-clavulanate (AUGMENTIN) 875-125 MG tablet Take 1 tablet by mouth every 12 (twelve) hours. (Patient not taking: Reported on 04/10/2018) 20 tablet 0  . cetirizine (ZYRTEC) 10 MG tablet Take 10 mg by mouth daily.     No current facility-administered medications on file prior to visit.     Review of Systems Patient denies any headaches, blurred vision, shortness of  breath, chest pain, abdominal pain, problems with bowel movements, urination, or intercourse.  Objective:  BP 130/84   Pulse 84   Ht 5\' 6"  (1.676 m)   Wt 155 lb 4.8 oz (70.4 kg)   LMP 03/27/2018   BMI 25.07 kg/m  Physical Exam  General:  Well developed, well nourished, no acute distress. She is alert and oriented x3. Skin:  Warm and dry Neck:  Midline trachea, no thyromegaly or nodules Cardiovascular: Regular rate and rhythm, no murmur heard Lungs:  Effort normal, all lung fields clear to auscultation bilaterally Breasts:  No dominant palpable mass, retraction, or nipple discharge Abdomen:  Soft, non tender, no hepatosplenomegaly or masses Pelvic:  External genitalia is normal in appearance.  The vagina is normal in appearance. The cervix is bulbous, no CMT.  Thin prep pap is not done . Uterus is felt to be normal size, shape, and contour.  No adnexal masses or tenderness noted. Extremities:  No swelling or varicosities noted Psych:  She has a normal mood and affect  Assessment:   Healthy well-woman exam Mild fatigue  Plan:  Labs obtained- will follow up accordingly F/U 1 year for AE, or sooner if needed   Melody Suzan NailerN Shambley, CNM

## 2018-04-11 LAB — THYROID PANEL WITH TSH
Free Thyroxine Index: 2.1 (ref 1.2–4.9)
T3 Uptake Ratio: 29 % (ref 24–39)
T4, Total: 7.2 ug/dL (ref 4.5–12.0)
TSH: 1.19 u[IU]/mL (ref 0.450–4.500)

## 2018-04-11 LAB — COMPREHENSIVE METABOLIC PANEL
A/G RATIO: 1.8 (ref 1.2–2.2)
ALBUMIN: 4.2 g/dL (ref 3.5–5.5)
ALK PHOS: 49 IU/L (ref 39–117)
ALT: 14 IU/L (ref 0–32)
AST: 25 IU/L (ref 0–40)
BUN / CREAT RATIO: 10 (ref 9–23)
BUN: 11 mg/dL (ref 6–20)
Bilirubin Total: 1.3 mg/dL — ABNORMAL HIGH (ref 0.0–1.2)
CO2: 26 mmol/L (ref 20–29)
CREATININE: 1.1 mg/dL — AB (ref 0.57–1.00)
Calcium: 9.5 mg/dL (ref 8.7–10.2)
Chloride: 99 mmol/L (ref 96–106)
GFR calc Af Amer: 76 mL/min/{1.73_m2} (ref >59)
GFR, EST NON AFRICAN AMERICAN: 66 mL/min/{1.73_m2} (ref >59)
GLOBULIN, TOTAL: 2.4 g/dL (ref 1.5–4.5)
Glucose: 80 mg/dL (ref 65–99)
Potassium: 4.3 mmol/L (ref 3.5–5.2)
SODIUM: 136 mmol/L (ref 134–144)
Total Protein: 6.6 g/dL (ref 6.0–8.5)

## 2018-04-11 LAB — LIPID PANEL
CHOLESTEROL TOTAL: 136 mg/dL (ref 100–199)
Chol/HDL Ratio: 2.3 ratio (ref 0.0–4.4)
HDL: 60 mg/dL (ref >39)
LDL Calculated: 63 mg/dL (ref 0–99)
TRIGLYCERIDES: 66 mg/dL (ref 0–149)
VLDL Cholesterol Cal: 13 mg/dL (ref 5–40)

## 2018-04-11 LAB — CBC
Hematocrit: 37.7 % (ref 34.0–46.6)
Hemoglobin: 12.8 g/dL (ref 11.1–15.9)
MCH: 28.7 pg (ref 26.6–33.0)
MCHC: 34 g/dL (ref 31.5–35.7)
MCV: 85 fL (ref 79–97)
PLATELETS: 192 10*3/uL (ref 150–450)
RBC: 4.46 x10E6/uL (ref 3.77–5.28)
RDW: 13 % (ref 12.3–15.4)
WBC: 4.1 10*3/uL (ref 3.4–10.8)

## 2018-04-15 ENCOUNTER — Telehealth: Payer: Self-pay | Admitting: *Deleted

## 2018-04-15 NOTE — Telephone Encounter (Signed)
Mailed all labs to pt 

## 2018-04-15 NOTE — Telephone Encounter (Signed)
-----   Message from Purcell NailsMelody N Shambley, [place] sent at 04/15/2018  1:57 PM EDT ----- All labs are normal

## 2018-04-17 ENCOUNTER — Telehealth: Payer: Self-pay | Admitting: Obstetrics and Gynecology

## 2018-04-17 NOTE — Telephone Encounter (Signed)
Mailed .pt lab results.

## 2018-04-17 NOTE — Telephone Encounter (Signed)
Patient called requesting lab results. Thanks.

## 2018-04-18 ENCOUNTER — Telehealth: Payer: Self-pay | Admitting: Obstetrics and Gynecology

## 2018-04-18 NOTE — Telephone Encounter (Signed)
Called pt labs normal

## 2018-04-18 NOTE — Telephone Encounter (Signed)
The patient called and stated that she called yesterday and just now today to check on her results. The patient would like a call back from her nurse and is a little uneasy with the time frame of waiting for the results. Please advise.

## 2019-04-16 ENCOUNTER — Encounter: Payer: Self-pay | Admitting: Obstetrics and Gynecology

## 2019-04-16 ENCOUNTER — Ambulatory Visit (INDEPENDENT_AMBULATORY_CARE_PROVIDER_SITE_OTHER): Payer: Managed Care, Other (non HMO) | Admitting: Obstetrics and Gynecology

## 2019-04-16 ENCOUNTER — Other Ambulatory Visit: Payer: Self-pay

## 2019-04-16 VITALS — BP 116/82 | HR 70 | Ht 66.0 in | Wt 140.1 lb

## 2019-04-16 DIAGNOSIS — Z01419 Encounter for gynecological examination (general) (routine) without abnormal findings: Secondary | ICD-10-CM | POA: Diagnosis not present

## 2019-04-16 DIAGNOSIS — Z803 Family history of malignant neoplasm of breast: Secondary | ICD-10-CM

## 2019-04-16 NOTE — Progress Notes (Signed)
Subjective:   Lori Kramer is a 33 y.o. G2P0 Caucasian female here for a routine well-woman exam.  Patient's last menstrual period was 03/29/2019.    Current complaints: none, menses are normal PCP: Kary Kos       doesn't desire labs  Social History: Sexual: heterosexual Marital Status: married Living situation: with family Occupation: Barista at [employer] Tobacco/alcohol: no tobacco use Illicit drugs: no history of illicit drug use  The following portions of the patient's history were reviewed and updated as appropriate: allergies, current medications, past family history, past medical history, past social history, past surgical history and problem list.  Past Medical History Past Medical History:  Diagnosis Date  . Migraine   . Vaginal Pap smear, abnormal 2013    Past Surgical History Past Surgical History:  Procedure Laterality Date  . COLPOSCOPY  2 5 16     Gynecologic History G2P0  Patient's last menstrual period was 03/29/2019. Contraception: coitus interruptus Last Pap: 2018. Results were: normal   Obstetric History OB History  Gravida Para Term Preterm AB Living  2         2  SAB TAB Ectopic Multiple Live Births          2    # Outcome Date GA Lbr Len/2nd Weight Sex Delivery Anes PTL Lv  2 Gravida 02/21/14    F Vag-Spont   LIV  1 Gravida 2009    M    LIV    Current Medications Current Outpatient Medications on File Prior to Visit  Medication Sig Dispense Refill  . cetirizine (ZYRTEC) 10 MG tablet Take 10 mg by mouth daily.    Marland Kitchen amoxicillin-clavulanate (AUGMENTIN) 875-125 MG tablet Take 1 tablet by mouth every 12 (twelve) hours. (Patient not taking: Reported on 04/10/2018) 20 tablet 0  . fexofenadine (ALLEGRA) 180 MG tablet Take 180 mg by mouth daily.     No current facility-administered medications on file prior to visit.     Review of Systems Patient denies any headaches, blurred vision, shortness of breath, chest pain, abdominal pain,  problems with bowel movements, urination, or intercourse.  Objective:  BP 116/82   Pulse 70   Ht 5\' 6"  (1.676 m)   Wt 140 lb 1.6 oz (63.5 kg)   LMP 03/29/2019   BMI 22.61 kg/m  Physical Exam  General:  Well developed, well nourished, no acute distress. She is alert and oriented x3. Skin:  Warm and dry Neck:  Midline trachea, no thyromegaly or nodules Cardiovascular: Regular rate and rhythm, no murmur heard Lungs:  Effort normal, all lung fields clear to auscultation bilaterally Breasts:  No dominant palpable mass, retraction, or nipple discharge Abdomen:  Soft, non tender, no hepatosplenomegaly or masses Pelvic:  External genitalia is normal in appearance.  The vagina is normal in appearance. The cervix is bulbous, no CMT.  Thin prep pap is not done . Uterus is felt to be normal size, shape, and contour.  No adnexal masses or tenderness noted. Extremities:  No swelling or varicosities noted Psych:  She has a normal mood and affect  Assessment:   Healthy well-woman exam Declines flu vaccine Family h/o breast cancer in 2 maternal Aunt  Plan:   F/U 1 year for AE, or sooner if needed   Donnie Gedeon Rockney Ghee, CNM

## 2019-04-16 NOTE — Patient Instructions (Signed)
 Preventive Care 21-35 Years Old, Female Preventive care refers to visits with your health care provider and lifestyle choices that can promote health and wellness. This includes:  A yearly physical exam. This may also be called an annual well check.  Regular dental visits and eye exams.  Immunizations.  Screening for certain conditions.  Healthy lifestyle choices, such as eating a healthy diet, getting regular exercise, not using drugs or products that contain nicotine and tobacco, and limiting alcohol use. What can I expect for my preventive care visit? Physical exam Your health care provider will check your:  Height and weight. This may be used to calculate body mass index (BMI), which tells if you are at a healthy weight.  Heart rate and blood pressure.  Skin for abnormal spots. Counseling Your health care provider may ask you questions about your:  Alcohol, tobacco, and drug use.  Emotional well-being.  Home and relationship well-being.  Sexual activity.  Eating habits.  Work and work environment.  Method of birth control.  Menstrual cycle.  Pregnancy history. What immunizations do I need?  Influenza (flu) vaccine  This is recommended every year. Tetanus, diphtheria, and pertussis (Tdap) vaccine  You may need a Td booster every 10 years. Varicella (chickenpox) vaccine  You may need this if you have not been vaccinated. Human papillomavirus (HPV) vaccine  If recommended by your health care provider, you may need three doses over 6 months. Measles, mumps, and rubella (MMR) vaccine  You may need at least one dose of MMR. You may also need a second dose. Meningococcal conjugate (MenACWY) vaccine  One dose is recommended if you are age 19-21 years and a first-year college student living in a residence hall, or if you have one of several medical conditions. You may also need additional booster doses. Pneumococcal conjugate (PCV13) vaccine  You may need  this if you have certain conditions and were not previously vaccinated. Pneumococcal polysaccharide (PPSV23) vaccine  You may need one or two doses if you smoke cigarettes or if you have certain conditions. Hepatitis A vaccine  You may need this if you have certain conditions or if you travel or work in places where you may be exposed to hepatitis A. Hepatitis B vaccine  You may need this if you have certain conditions or if you travel or work in places where you may be exposed to hepatitis B. Haemophilus influenzae type b (Hib) vaccine  You may need this if you have certain conditions. You may receive vaccines as individual doses or as more than one vaccine together in one shot (combination vaccines). Talk with your health care provider about the risks and benefits of combination vaccines. What tests do I need?  Blood tests  Lipid and cholesterol levels. These may be checked every 5 years starting at age 20.  Hepatitis C test.  Hepatitis B test. Screening  Diabetes screening. This is done by checking your blood sugar (glucose) after you have not eaten for a while (fasting).  Sexually transmitted disease (STD) testing.  BRCA-related cancer screening. This may be done if you have a family history of breast, ovarian, tubal, or peritoneal cancers.  Pelvic exam and Pap test. This may be done every 3 years starting at age 21. Starting at age 30, this may be done every 5 years if you have a Pap test in combination with an HPV test. Talk with your health care provider about your test results, treatment options, and if necessary, the need for more   tests. Follow these instructions at home: Eating and drinking   Eat a diet that includes fresh fruits and vegetables, whole grains, lean protein, and low-fat dairy.  Take vitamin and mineral supplements as recommended by your health care provider.  Do not drink alcohol if: ? Your health care provider tells you not to drink. ? You are  pregnant, may be pregnant, or are planning to become pregnant.  If you drink alcohol: ? Limit how much you have to 0-1 drink a day. ? Be aware of how much alcohol is in your drink. In the [place] one drink equals one 12 oz bottle of beer (355 mL), one 5 oz glass of wine (148 mL), or one 1 oz glass of hard liquor (44 mL). Lifestyle  Take daily care of your teeth and gums.  Stay active. Exercise for at least 30 minutes on 5 or more days each week.  Do not use any products that contain nicotine or tobacco, such as cigarettes, e-cigarettes, and chewing tobacco. If you need help quitting, ask your health care provider.  If you are sexually active, practice safe sex. Use a condom or other form of birth control (contraception) in order to prevent pregnancy and STIs (sexually transmitted infections). If you plan to become pregnant, see your health care provider for a preconception visit. What's next?  Visit your health care provider once a year for a well check visit.  Ask your health care provider how often you should have your eyes and teeth checked.  Stay up to date on all vaccines. This information is not intended to replace advice given to you by your health care provider. Make sure you discuss any questions you have with your health care provider. Document Released: 09/11/2001 Document Revised: 03/27/2018 Document Reviewed: 03/27/2018 Elsevier Patient Education  2020 [employer].

## 2020-05-04 NOTE — Patient Instructions (Signed)
I value your feedback and entrusting us with your care. If you get a [facility] patient survey, I would appreciate you taking the time to let us know about your experience today. Thank you!  As of July 09, 2019, your lab results will be released to your MyChart immediately, before I even have a chance to see them. Please give me time to review them and contact you if there are any abnormalities. Thank you for your patience.  

## 2020-05-04 NOTE — Progress Notes (Signed)
PCP:  Jerl Mina, MD   Chief Complaint  Patient presents with   Gynecologic Exam     HPI:      [name] is a 34 y.o. G2P0 whose LMP was Patient's last menstrual period was 04/15/2020 (approximate)., presents today for her NP annual examination.  Her menses are regular every 28-30 days, lasting 4 days.  Dysmenorrhea mild. She does not have intermenstrual bleeding.  Sex activity: single partner, contraception - vasectomy.  Last Pap: 04/09/17 Results were: no abnormalities /neg HPV DNA 2017; hx of abn pap with neg bx a few yrs ago.  Last mammogram: never There is a FH of breast cancer in 2 mat aunts. Genetic testing not done.There is no FH of ovarian cancer. The patient does do self-breast exams.  Tobacco use: The patient denies current or previous tobacco use. Alcohol use: none No drug use.  Exercise: moderately active  She does get adequate calcium but not Vitamin D in her diet. Normal recent labs with insurance co for new policy.   The pregnancy intention screening data noted above was reviewed. Potential methods of contraception were discussed. The patient elected to proceed with Vasectomy.    Past Medical History:  Diagnosis Date   Family history of breast cancer    Migraine    Vaginal Pap smear, abnormal 2013    Past Surgical History:  Procedure Laterality Date   COLPOSCOPY  2 5 16     Family History  Problem Relation Age of Onset   Breast cancer Maternal Aunt        37   Healthy Mother    Healthy Father    Breast cancer Maternal Aunt        5    Social History   Socioeconomic History   Marital status: Married    Spouse name: Not on file   Number of children: Not on file   Years of education: Not on file   Highest education level: Not on file  Occupational History   Not on file  Tobacco Use   Smoking status: Never Smoker   Smokeless tobacco: Never Used  Vaping Use   Vaping Use: Never used  Substance and Sexual  Activity   Alcohol use: Yes    Comment: occas   Drug use: No   Sexual activity: Yes    Birth control/protection: Coitus interruptus  Other Topics Concern   Not on file  Social History Narrative   Not on file   Social Determinants of Health   Financial Resource Strain:    Difficulty of Paying Living Expenses: Not on file  Food Insecurity:    Worried About 42 in the Last Year: Not on file   Programme researcher, broadcasting/film/video of Food in the Last Year: Not on file  Transportation Needs:    Lack of Transportation (Medical): Not on file   Lack of Transportation (Non-Medical): Not on file  Physical Activity:    Days of Exercise per Week: Not on file   Minutes of Exercise per Session: Not on file  Stress:    Feeling of Stress : Not on file  Social Connections:    Frequency of Communication with Friends and Family: Not on file   Frequency of Social Gatherings with Friends and Family: Not on file   Attends Religious Services: Not on file   Active Member of Clubs or Organizations: Not on file   Attends [employer] Meetings: Not on file   Marital Status:  Not on file  Intimate Partner Violence:    Fear of Current or Ex-Partner: Not on file   Emotionally Abused: Not on file   Physically Abused: Not on file   Sexually Abused: Not on file    No current outpatient medications on file.     ROS:  Review of Systems  Constitutional: Negative for fatigue, fever and unexpected weight change.  Respiratory: Negative for cough, shortness of breath and wheezing.   Cardiovascular: Negative for chest pain, palpitations and leg swelling.  Gastrointestinal: Negative for blood in stool, constipation, diarrhea, nausea and vomiting.  Endocrine: Negative for cold intolerance, heat intolerance and polyuria.  Genitourinary: Negative for dyspareunia, dysuria, flank pain, frequency, genital sores, hematuria, menstrual problem, pelvic pain, urgency, vaginal bleeding, vaginal  discharge and vaginal pain.  Musculoskeletal: Negative for back pain, joint swelling and myalgias.  Skin: Negative for rash.  Neurological: Negative for dizziness, syncope, light-headedness, numbness and headaches.  Hematological: Negative for adenopathy.  Psychiatric/Behavioral: Negative for agitation, confusion, sleep disturbance and suicidal ideas. The patient is not nervous/anxious.   BREAST: No symptoms   Objective: BP 100/70    Ht 5\' 6"  (1.676 m)    Wt 146 lb (66.2 kg)    LMP 04/15/2020 (Approximate)    BMI 23.57 kg/m    Physical Exam Constitutional:      Appearance: She is well-developed.  Genitourinary:     Vulva, vagina, cervix, uterus, right adnexa and left adnexa normal.     No vulval lesion or tenderness noted.     No vaginal discharge, erythema or tenderness.     No cervical polyp.     Uterus is not enlarged or tender.     No right or left adnexal mass present.     Right adnexa not tender.     Left adnexa not tender.  Neck:     Thyroid: No thyromegaly.  Cardiovascular:     Rate and Rhythm: Normal rate and regular rhythm.     Heart sounds: Normal heart sounds. No murmur heard.   Pulmonary:     Effort: Pulmonary effort is normal.     Breath sounds: Normal breath sounds.  Chest:     Breasts:        Right: No mass, nipple discharge, skin change or tenderness.        Left: No mass, nipple discharge, skin change or tenderness.  Abdominal:     Palpations: Abdomen is soft.     Tenderness: There is no abdominal tenderness. There is no guarding.  Musculoskeletal:        General: Normal range of motion.     Cervical back: Normal range of motion.  Neurological:     General: No focal deficit present.     Mental Status: She is alert and oriented to person, place, and time.     Cranial Nerves: No cranial nerve deficit.  Skin:    General: Skin is warm and dry.  Psychiatric:        Mood and Affect: Mood normal.        Behavior: Behavior normal.        Thought  Content: Thought content normal.        Judgment: Judgment normal.  Vitals reviewed.     Assessment/Plan: Encounter for annual routine gynecological examination  Cervical cancer screening - Plan: Cytology - PAP  Screening for HPV (human papillomavirus) - Plan: Cytology - PAP  Encounter for screening mammogram for malignant neoplasm of breast - Plan: MM 3D  SCREEN BREAST BILATERAL; pt to sched mammo  Family history of breast cancer - Plan: MM 3D SCREEN BREAST BILATERAL; MyRisk testing discussed and handout given. Pt to consider and f/u for testing prn.            GYN counsel breast self exam, mammography screening, adequate intake of calcium and vitamin D, diet and exercise     F/U  Return in about 1 year (around 05/05/2021).  Staton Markey B. Satya Bohall, PA-C 05/05/2020 9:20 AM

## 2020-05-05 ENCOUNTER — Other Ambulatory Visit: Payer: Self-pay

## 2020-05-05 ENCOUNTER — Ambulatory Visit (INDEPENDENT_AMBULATORY_CARE_PROVIDER_SITE_OTHER): Payer: 59 | Admitting: Obstetrics and Gynecology

## 2020-05-05 ENCOUNTER — Encounter: Payer: Self-pay | Admitting: Obstetrics and Gynecology

## 2020-05-05 ENCOUNTER — Other Ambulatory Visit (HOSPITAL_COMMUNITY)
Admission: RE | Admit: 2020-05-05 | Discharge: 2020-05-05 | Disposition: A | Discharge status: Home / Self Care | Payer: 59 | Source: Ambulatory Visit | Attending: Obstetrics and Gynecology | Admitting: Obstetrics and Gynecology

## 2020-05-05 VITALS — BP 100/70 | Ht 66.0 in | Wt 146.0 lb

## 2020-05-05 DIAGNOSIS — Z1151 Encounter for screening for human papillomavirus (HPV): Secondary | ICD-10-CM

## 2020-05-05 DIAGNOSIS — Z124 Encounter for screening for malignant neoplasm of cervix: Secondary | ICD-10-CM

## 2020-05-05 DIAGNOSIS — Z803 Family history of malignant neoplasm of breast: Secondary | ICD-10-CM

## 2020-05-05 DIAGNOSIS — Z1231 Encounter for screening mammogram for malignant neoplasm of breast: Secondary | ICD-10-CM

## 2020-05-05 DIAGNOSIS — Z01419 Encounter for gynecological examination (general) (routine) without abnormal findings: Secondary | ICD-10-CM

## 2020-05-06 LAB — CYTOLOGY - PAP
Comment: NEGATIVE
Diagnosis: NEGATIVE
High risk HPV: NEGATIVE

## 2021-03-23 ENCOUNTER — Other Ambulatory Visit: Payer: 59

## 2021-05-08 ENCOUNTER — Ambulatory Visit: Payer: 59 | Admitting: Obstetrics and Gynecology

## 2021-05-23 ENCOUNTER — Ambulatory Visit: Payer: 59 | Admitting: Obstetrics and Gynecology

## 2021-06-27 ENCOUNTER — Ambulatory Visit: Payer: 59 | Admitting: Obstetrics and Gynecology

## 2021-09-27 ENCOUNTER — Ambulatory Visit (INDEPENDENT_AMBULATORY_CARE_PROVIDER_SITE_OTHER): Payer: Managed Care, Other (non HMO) | Admitting: Obstetrics and Gynecology

## 2021-09-27 ENCOUNTER — Other Ambulatory Visit: Payer: Self-pay

## 2021-09-27 ENCOUNTER — Encounter: Payer: Self-pay | Admitting: Obstetrics and Gynecology

## 2021-09-27 VITALS — BP 118/60 | Ht 66.0 in | Wt 154.0 lb

## 2021-09-27 DIAGNOSIS — Z1231 Encounter for screening mammogram for malignant neoplasm of breast: Secondary | ICD-10-CM

## 2021-09-27 DIAGNOSIS — Z1371 Encounter for nonprocreative screening for genetic disease carrier status: Secondary | ICD-10-CM

## 2021-09-27 DIAGNOSIS — Z9189 Other specified personal risk factors, not elsewhere classified: Secondary | ICD-10-CM

## 2021-09-27 DIAGNOSIS — Z803 Family history of malignant neoplasm of breast: Secondary | ICD-10-CM | POA: Diagnosis not present

## 2021-09-27 DIAGNOSIS — Z01419 Encounter for gynecological examination (general) (routine) without abnormal findings: Secondary | ICD-10-CM

## 2021-09-27 HISTORY — DX: Encounter for nonprocreative screening for genetic disease carrier status: Z13.71

## 2021-09-27 HISTORY — DX: Other specified personal risk factors, not elsewhere classified: Z91.89

## 2021-09-27 NOTE — Progress Notes (Signed)
? ?PCP:  [place] Pink, MD ? ? ?Chief Complaint  ?Patient presents with  ? Gynecologic Exam  ? ? ? ?HPI: ?     [name] is a 36 y.o. G2P0 whose LMP was Patient's last menstrual period was 09/18/2021 (exact date)., presents today for her annual examination.  Her menses are regular every 28-30 days, lasting 4 days, light flow.  Dysmenorrhea mild. She does not have intermenstrual bleeding. ? ?Sex activity: single partner, contraception - vasectomy.  ?Last Pap: 05/05/20 Results were: no abnormalities /neg HPV DNA; hx of abn pap with neg bx a few yrs ago. ? ?Last mammogram: never ?There is a FH of breast cancer in 2 mat aunts, genetic testing not done.There is no FH of ovarian cancer. The patient does do self-breast exams. Feels an occas tugging sensation RT breast lasting for a few min, no masses. Not related to menses.  ? ?Tobacco use: The patient denies current or previous tobacco use. ?Alcohol use: none ?No drug use.  ?Exercise: moderately active ? ?She does get adequate calcium and Vitamin D in her diet. ? ? ?Past Medical History:  ?Diagnosis Date  ? Family history of breast cancer   ? Migraine   ? Vaginal Pap smear, abnormal 2013  ? ? ?Past Surgical History:  ?Procedure Laterality Date  ? COLPOSCOPY  '2 5 16  ' ? ? ?Family History  ?Problem Relation Age of Onset  ? Breast cancer Maternal Aunt   ?     71  ? Healthy Mother   ? Healthy Father   ? Breast cancer Maternal Aunt   ?     26  ? ? ?Social History  ? ?Socioeconomic History  ? Marital status: Married  ?  Spouse name: Not on file  ? Number of children: Not on file  ? Years of education: Not on file  ? Highest education level: Not on file  ?Occupational History  ? Not on file  ?Tobacco Use  ? Smoking status: Never  ? Smokeless tobacco: Never  ?Vaping Use  ? Vaping Use: Never used  ?Substance and Sexual Activity  ? Alcohol use: Yes  ?  Comment: occas  ? Drug use: No  ? Sexual activity: Yes  ?  Birth control/protection: Coitus interruptus  ?Other Topics  Concern  ? Not on file  ?Social History Narrative  ? Not on file  ? ?Social Determinants of Health  ? ?Financial Resource Strain: Not on file  ?Food Insecurity: Not on file  ?Transportation Needs: Not on file  ?Physical Activity: Not on file  ?Stress: Not on file  ?Social Connections: Not on file  ?Intimate Partner Violence: Not on file  ? ? ?No current outpatient medications on file. ? ? ? ? ?ROS: ? ?Review of Systems  ?Constitutional:  Negative for fatigue, fever and unexpected weight change.  ?Respiratory:  Negative for cough, shortness of breath and wheezing.   ?Cardiovascular:  Negative for chest pain, palpitations and leg swelling.  ?Gastrointestinal:  Negative for blood in stool, constipation, diarrhea, nausea and vomiting.  ?Endocrine: Negative for cold intolerance, heat intolerance and polyuria.  ?Genitourinary:  Negative for dyspareunia, dysuria, flank pain, frequency, genital sores, hematuria, menstrual problem, pelvic pain, urgency, vaginal bleeding, vaginal discharge and vaginal pain.  ?Musculoskeletal:  Negative for back pain, joint swelling and myalgias.  ?Skin:  Negative for rash.  ?Neurological:  Negative for dizziness, syncope, light-headedness, numbness and headaches.  ?Hematological:  Negative for adenopathy.  ?Psychiatric/Behavioral:  Negative for agitation, confusion,  sleep disturbance and suicidal ideas. The patient is not nervous/anxious.  BREAST: No symptoms ? ? ?Objective: ?BP 118/60   Ht '5\' 6"'  (1.676 m)   Wt 154 lb (69.9 kg)   LMP 09/18/2021 (Exact Date)   BMI 24.86 kg/m?  ? ? ?Physical Exam ?Constitutional:   ?   Appearance: She is well-developed.  ?Genitourinary:  ?   Vulva normal.  ?   Right Labia: No rash, tenderness or lesions. ?   Left Labia: No tenderness, lesions or rash. ?   No vaginal discharge, erythema or tenderness.  ? ?   Right Adnexa: not tender and no mass present. ?   Left Adnexa: not tender and no mass present. ?   No cervical friability or polyp.  ?   Uterus is not  enlarged or tender.  ?Breasts: ?   Right: No mass, nipple discharge, skin change or tenderness.  ?   Left: No mass, nipple discharge, skin change or tenderness.  ?Neck:  ?   Thyroid: No thyromegaly.  ?Cardiovascular:  ?   Rate and Rhythm: Normal rate and regular rhythm.  ?   Heart sounds: Normal heart sounds. No murmur heard. ?Pulmonary:  ?   Effort: Pulmonary effort is normal.  ?   Breath sounds: Normal breath sounds.  ?Abdominal:  ?   Palpations: Abdomen is soft.  ?   Tenderness: There is no abdominal tenderness. There is no guarding or rebound.  ?Musculoskeletal:     ?   General: Normal range of motion.  ?   Cervical back: Normal range of motion.  ?Lymphadenopathy:  ?   Cervical: No cervical adenopathy.  ?Neurological:  ?   General: No focal deficit present.  ?   Mental Status: She is alert and oriented to person, place, and time.  ?   Cranial Nerves: No cranial nerve deficit.  ?Skin: ?   General: Skin is warm and dry.  ?Psychiatric:     ?   Mood and Affect: Mood normal.     ?   Behavior: Behavior normal.     ?   Thought Content: Thought content normal.     ?   Judgment: Judgment normal.  ?Vitals reviewed.  ? ? ?Assessment/Plan: ?Encounter for annual routine gynecological examination ? ?Encounter for screening mammogram for malignant neoplasm of breast - Plan: MM 3D SCREEN BREAST BILATERAL; pt to schedule due to [place] breast cancer ? ?Family history of breast cancer - Plan: MM 3D SCREEN BREAST BILATERAL, Integrated [facility] ([place]); MyRisk testing discussed and done today. Will f/u with results.  ? ? ?GYN counsel breast self exam, mammography screening, adequate intake of calcium and vitamin D, diet and exercise ? ? ?  F/U ? Return in about 1 year (around 09/28/2022). ? ?Arnold Depinto B. Datha Kissinger, PA-C ?09/27/2021 ?4:01 PM ?

## 2021-09-27 NOTE — Patient Instructions (Addendum)
I value your feedback and you entrusting us with your care. If you get a [facility] patient survey, I would appreciate you taking the time to let us know about your experience today. Thank you!  [facility]: [phone]  [facility]: [phone]    

## 2021-10-12 ENCOUNTER — Ambulatory Visit
Admission: RE | Admit: 2021-10-12 | Discharge: 2021-10-12 | Disposition: A | Discharge status: Home / Self Care | Payer: Managed Care, Other (non HMO) | Source: Ambulatory Visit | Attending: Obstetrics and Gynecology | Admitting: Obstetrics and Gynecology

## 2021-10-12 ENCOUNTER — Other Ambulatory Visit: Payer: Self-pay

## 2021-10-12 DIAGNOSIS — Z803 Family history of malignant neoplasm of breast: Secondary | ICD-10-CM | POA: Insufficient documentation

## 2021-10-12 DIAGNOSIS — Z1231 Encounter for screening mammogram for malignant neoplasm of breast: Secondary | ICD-10-CM | POA: Insufficient documentation

## 2021-10-13 ENCOUNTER — Other Ambulatory Visit: Payer: Self-pay | Admitting: Obstetrics and Gynecology

## 2021-10-13 DIAGNOSIS — N63 Unspecified lump in unspecified breast: Secondary | ICD-10-CM

## 2021-10-13 DIAGNOSIS — R921 Mammographic calcification found on diagnostic imaging of breast: Secondary | ICD-10-CM

## 2021-10-13 DIAGNOSIS — R928 Other abnormal and inconclusive findings on diagnostic imaging of breast: Secondary | ICD-10-CM

## 2021-10-15 ENCOUNTER — Encounter: Payer: Self-pay | Admitting: Obstetrics and Gynecology

## 2021-10-26 ENCOUNTER — Encounter: Payer: Self-pay | Admitting: Obstetrics and Gynecology

## 2021-10-31 ENCOUNTER — Ambulatory Visit
Admission: RE | Admit: 2021-10-31 | Discharge: 2021-10-31 | Disposition: A | Discharge status: Home / Self Care | Payer: Managed Care, Other (non HMO) | Source: Ambulatory Visit | Attending: Obstetrics and Gynecology | Admitting: Obstetrics and Gynecology

## 2021-10-31 DIAGNOSIS — R928 Other abnormal and inconclusive findings on diagnostic imaging of breast: Secondary | ICD-10-CM | POA: Diagnosis present

## 2021-10-31 DIAGNOSIS — N63 Unspecified lump in unspecified breast: Secondary | ICD-10-CM | POA: Diagnosis present

## 2021-10-31 DIAGNOSIS — R921 Mammographic calcification found on diagnostic imaging of breast: Secondary | ICD-10-CM | POA: Insufficient documentation

## 2021-11-06 ENCOUNTER — Encounter: Payer: Self-pay | Admitting: Obstetrics and Gynecology

## 2021-11-22 ENCOUNTER — Telehealth: Payer: Self-pay | Admitting: Obstetrics and Gynecology

## 2021-11-22 NOTE — Telephone Encounter (Signed)
LMTRC. Pt already spoke with GC due to [employer] insurance re: neg MyRisk results except MET VUS. IBIS=23.7%/riskscore=18.5%.  ?Pt had mammo 3/23 with Cat 3 LT breast. Recommendations are for monthly SBE, yearly CBE and mammos, as well as scr breast MRI. Pt can do MRI before 9/23 if desires.  ?

## 2022-05-03 ENCOUNTER — Encounter: Payer: Self-pay | Admitting: Obstetrics and Gynecology

## 2022-05-03 ENCOUNTER — Ambulatory Visit
Admission: RE | Admit: 2022-05-03 | Discharge: 2022-05-03 | Disposition: A | Discharge status: Home / Self Care | Payer: Managed Care, Other (non HMO) | Source: Ambulatory Visit | Attending: Obstetrics and Gynecology | Admitting: Obstetrics and Gynecology

## 2022-05-03 DIAGNOSIS — R921 Mammographic calcification found on diagnostic imaging of breast: Secondary | ICD-10-CM

## 2022-05-03 DIAGNOSIS — R928 Other abnormal and inconclusive findings on diagnostic imaging of breast: Secondary | ICD-10-CM

## 2022-05-03 DIAGNOSIS — N63 Unspecified lump in unspecified breast: Secondary | ICD-10-CM | POA: Diagnosis present

## 2022-05-08 ENCOUNTER — Encounter: Payer: Self-pay | Admitting: Obstetrics and Gynecology

## 2022-05-08 DIAGNOSIS — Z1329 Encounter for screening for other suspected endocrine disorder: Secondary | ICD-10-CM

## 2022-05-08 DIAGNOSIS — R5383 Other fatigue: Secondary | ICD-10-CM

## 2022-05-08 DIAGNOSIS — Z0000 Encounter for general adult medical examination without abnormal findings: Secondary | ICD-10-CM

## 2022-05-17 ENCOUNTER — Other Ambulatory Visit: Payer: Managed Care, Other (non HMO)

## 2022-05-17 DIAGNOSIS — Z1329 Encounter for screening for other suspected endocrine disorder: Secondary | ICD-10-CM

## 2022-05-17 DIAGNOSIS — R5383 Other fatigue: Secondary | ICD-10-CM

## 2022-05-17 DIAGNOSIS — Z0000 Encounter for general adult medical examination without abnormal findings: Secondary | ICD-10-CM

## 2022-05-18 LAB — COMPREHENSIVE METABOLIC PANEL
ALT: 13 IU/L (ref 0–32)
AST: 23 IU/L (ref 0–40)
Albumin/Globulin Ratio: 1.9 (ref 1.2–2.2)
Albumin: 4.4 g/dL (ref 3.9–4.9)
Alkaline Phosphatase: 51 IU/L (ref 44–121)
BUN/Creatinine Ratio: 16 (ref 9–23)
BUN: 16 mg/dL (ref 6–20)
Bilirubin Total: 1.2 mg/dL (ref 0.0–1.2)
CO2: 25 mmol/L (ref 20–29)
Calcium: 9.2 mg/dL (ref 8.7–10.2)
Chloride: 102 mmol/L (ref 96–106)
Creatinine, Ser: 1.01 mg/dL — ABNORMAL HIGH (ref 0.57–1.00)
Globulin, Total: 2.3 g/dL (ref 1.5–4.5)
Glucose: 83 mg/dL (ref 70–99)
Potassium: 4.3 mmol/L (ref 3.5–5.2)
Sodium: 139 mmol/L (ref 134–144)
Total Protein: 6.7 g/dL (ref 6.0–8.5)
eGFR: 73 mL/min/{1.73_m2} (ref >59)

## 2022-05-18 LAB — CBC WITH DIFFERENTIAL/PLATELET
Basophils Absolute: 0.1 10*3/uL (ref 0.0–0.2)
Basos: 1 %
EOS (ABSOLUTE): 0.1 10*3/uL (ref 0.0–0.4)
Eos: 3 %
Hematocrit: 38.1 % (ref 34.0–46.6)
Hemoglobin: 12.6 g/dL (ref 11.1–15.9)
Immature Grans (Abs): 0 10*3/uL (ref 0.0–0.1)
Immature Granulocytes: 0 %
Lymphocytes Absolute: 1.8 10*3/uL (ref 0.7–3.1)
Lymphs: 34 %
MCH: 28.8 pg (ref 26.6–33.0)
MCHC: 33.1 g/dL (ref 31.5–35.7)
MCV: 87 fL (ref 79–97)
Monocytes Absolute: 0.4 10*3/uL (ref 0.1–0.9)
Monocytes: 8 %
Neutrophils Absolute: 2.8 10*3/uL (ref 1.4–7.0)
Neutrophils: 54 %
Platelets: 203 10*3/uL (ref 150–450)
RBC: 4.37 x10E6/uL (ref 3.77–5.28)
RDW: 12.9 % (ref 11.7–15.4)
WBC: 5.2 10*3/uL (ref 3.4–10.8)

## 2022-05-18 LAB — TSH+FREE T4
Free T4: 1.43 ng/dL (ref 0.82–1.77)
TSH: 0.903 u[IU]/mL (ref 0.450–4.500)

## 2023-04-06 IMAGING — MG MM DIGITAL DIAGNOSTIC UNILAT*L* W/ TOMO W/ CAD
7 series · 8 of 15 positions shown · non-contrast
Comparison: Previous exam(s).

CLINICAL DATA: Possible left breast mass and left breast
calcifications seen on most recent screening mammography.

EXAM:
DIGITAL DIAGNOSTIC UNILATERAL LEFT MAMMOGRAM WITH TOMOSYNTHESIS AND
CAD; ULTRASOUND LEFT BREAST LIMITED
TECHNIQUE: Left digital diagnostic mammography and breast tomosynthesis was
performed. The images were evaluated with computer-aided detection.;
Targeted ultrasound examination of the left breast was performed.

[L CC]
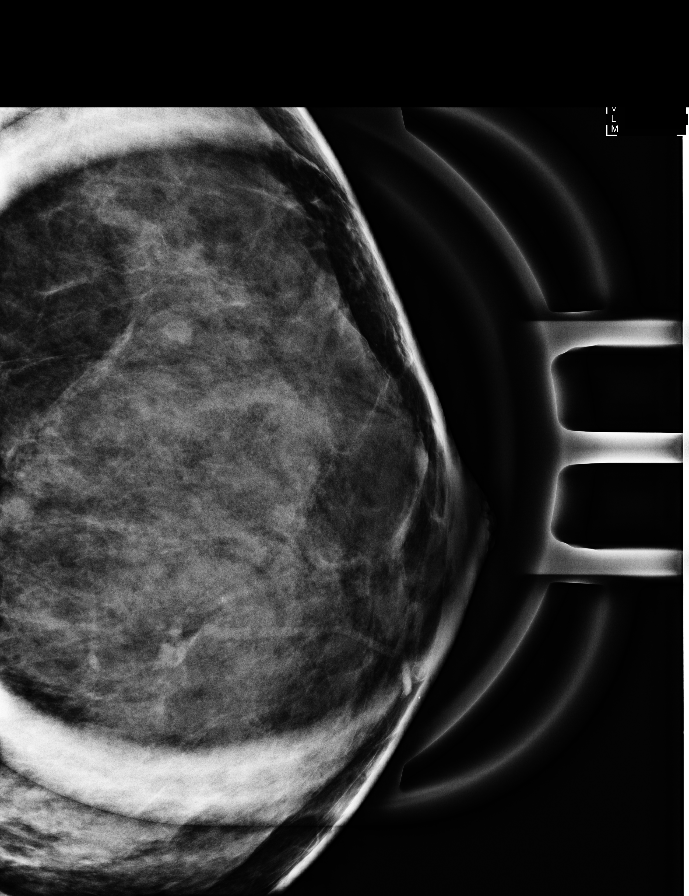

[L ML (1 of 2)]
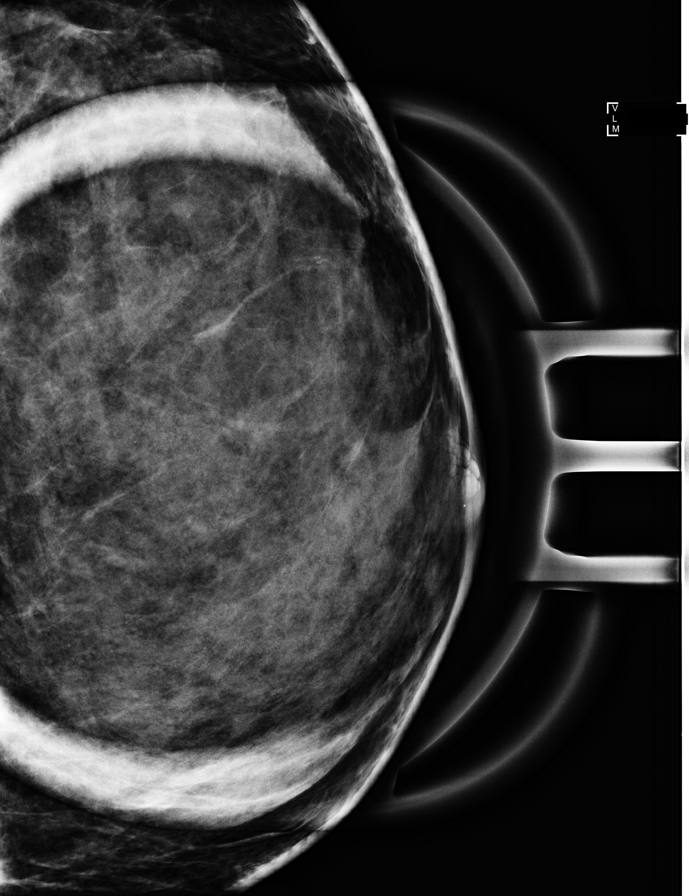

[L CC synth-2D]
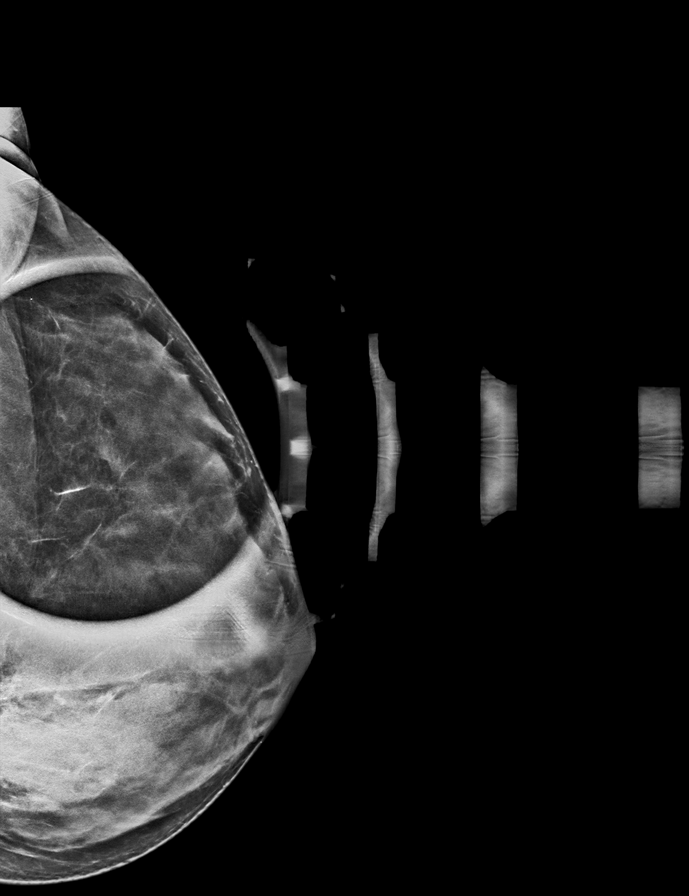

[L ML synth-2D]
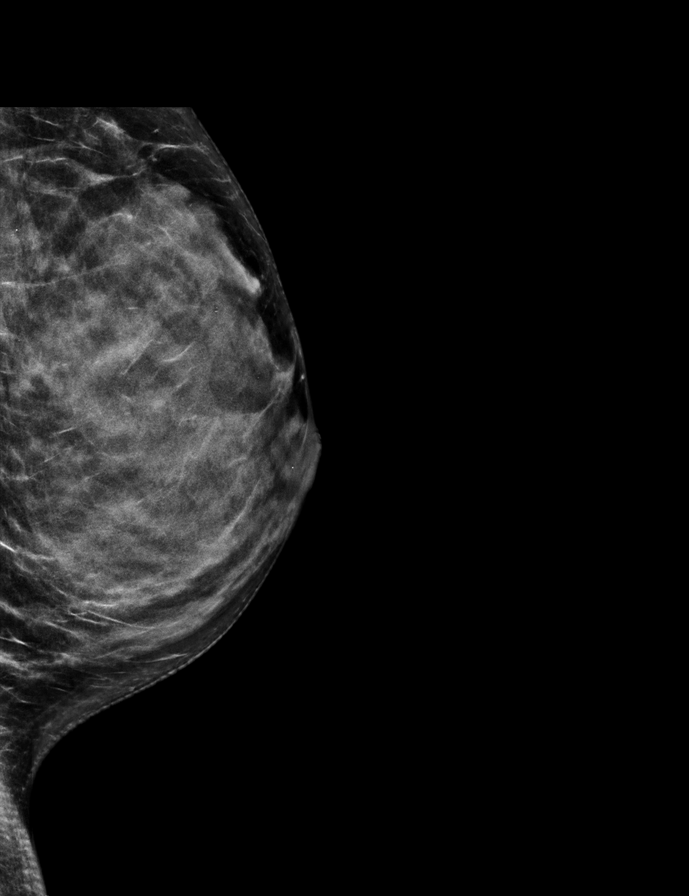

[L ML (2 of 2)]
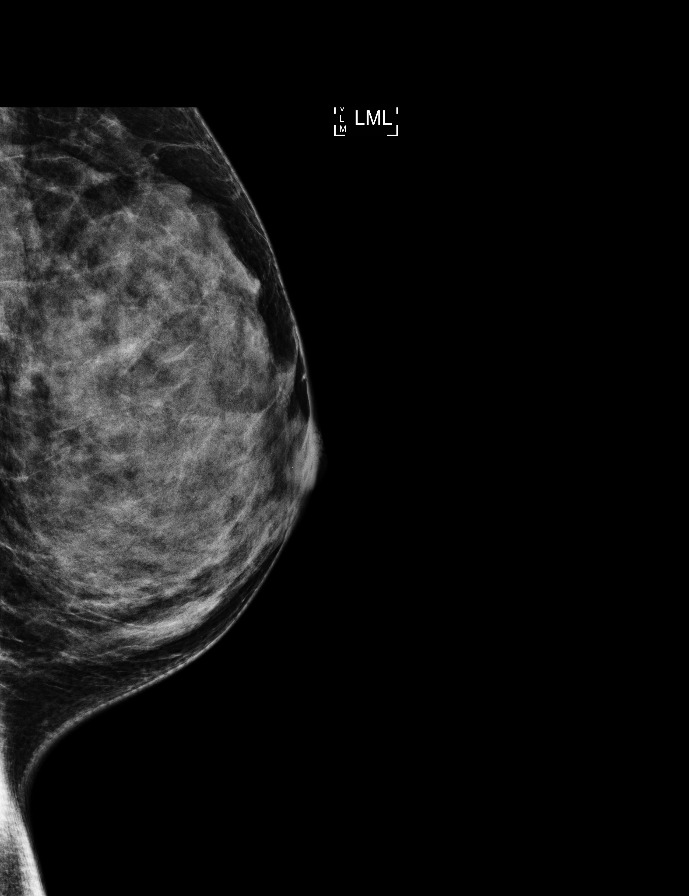

[L CC tomo · 2 of 54 frames shown]
[frame 18/54]
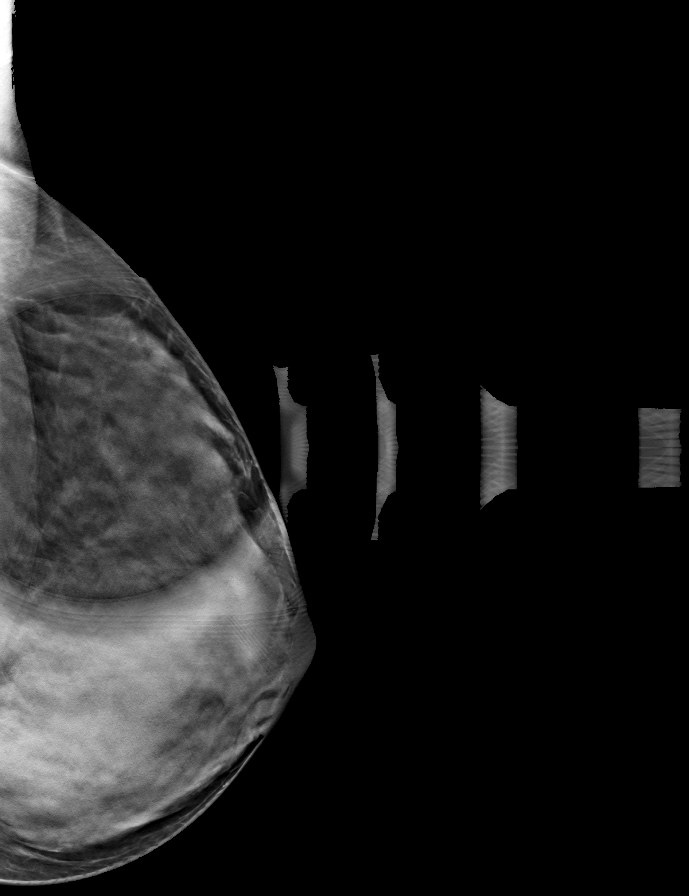
[frame 27/54]
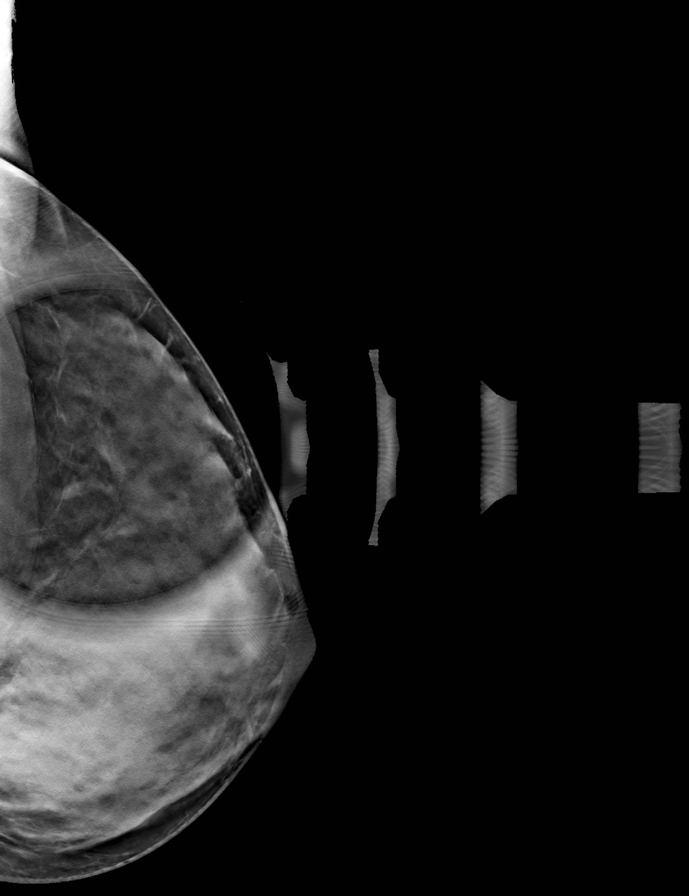

[L ML tomo · tomo slice 27/54.0]
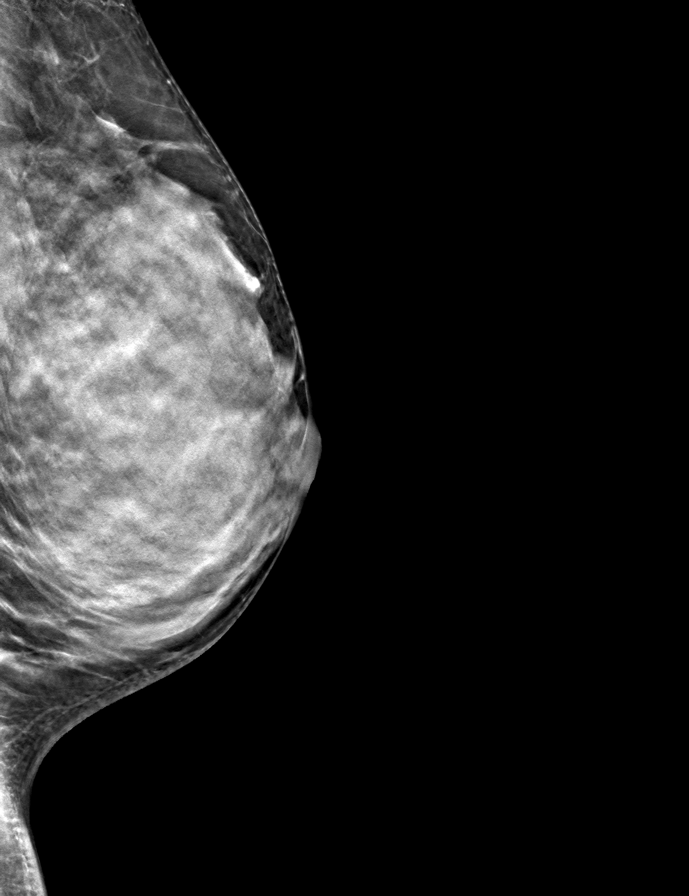

[8 of 15 positions shown; findings below may reference images not displayed]

ACR Breast Density Category d: The breast tissue is extremely dense,
which lowers the sensitivity of mammography.
FINDINGS: Additional mammographic views of the left breast demonstrate a 5 mm
circumscribed mass in the upper outer left breast, middle depth.
There is a tiny 2 mm group of calcifications in the upper slightly
inner left breast, middle depth, probably benign in morphology.

Targeted left breast ultrasound demonstrates 1 o'clock 3 cm from the
nipple benign-appearing cyst measuring 0.5 x 0.6 x 0.4 cm. This
finding likely corresponds to the mammographically seen mass.
IMPRESSION: Left breast probably benign mass and probably benign cluster of
calcifications, for which short-term imaging follow-up is
recommended.

RECOMMENDATION:
Left diagnostic mammogram and focused left breast ultrasound in 6
months.

I have discussed the findings and recommendations with the patient.
If applicable, a reminder letter will be sent to the patient
regarding the next appointment.

BI-RADS CATEGORY  3: Probably benign.

## 2024-03-16 ENCOUNTER — Encounter: Payer: Self-pay | Admitting: Obstetrics and Gynecology

## 2024-03-16 DIAGNOSIS — Z9189 Other specified personal risk factors, not elsewhere classified: Secondary | ICD-10-CM | POA: Insufficient documentation

## 2024-03-16 NOTE — Progress Notes (Unsigned)
 PCP:  Watt Bernarda NOVAK, PA-C   No chief complaint on file.    HPI:      [name] is a 38 y.o. G2P0 whose LMP was No LMP recorded., presents today for her annual examination.  Her menses are regular every 28-30 days, lasting 4 days, light flow.  Dysmenorrhea mild. She does not have intermenstrual bleeding.  Sex activity: single partner, contraception - vasectomy.  Last Pap: 05/05/20 Results were: no abnormalities /neg HPV DNA; hx of abn pap with neg bx a few yrs ago.  Last mammogram: 05/03/22 and 10/31/21; Results were Cat 3 due to LT breast cysts, 6 mo f/u recommended.  There is a FH of breast cancer in 2 mat aunts.There is no FH of ovarian cancer. The patient does do self-breast exams. Pt is MyRisk neg except MET VUS 3/23; IBIS=18.5%.riskscore=23.7%.    Feels an occas tugging sensation RT breast lasting for a few min, no masses. Not related to menses.   Tobacco use: The patient denies current or previous tobacco use. Alcohol use: none No drug use.  Exercise: moderately active  She does get adequate calcium and Vitamin D in her diet.   Past Medical History:  Diagnosis Date   BRCA negative 09/2021   MyRisk neg except MET VUS   Family history of breast cancer    Increased risk of breast cancer 09/2021   IBIS=18.5%.riskscore=23.7%   Migraine    Vaginal Pap smear, abnormal 2013    Past Surgical History:  Procedure Laterality Date   COLPOSCOPY  2 5 16     Family History  Problem Relation Age of Onset   Healthy Mother    Healthy Father    Breast cancer Maternal Aunt        24   Breast cancer Maternal Aunt        74    Social History   Socioeconomic History   Marital status: Married    Spouse name: Not on file   Number of children: Not on file   Years of education: Not on file   Highest education level: Not on file  Occupational History   Not on file  Tobacco Use   Smoking status: Never   Smokeless tobacco: Never  Vaping Use   Vaping status: Never  Used  Substance and Sexual Activity   Alcohol use: Yes    Comment: occas   Drug use: No   Sexual activity: Yes    Birth control/protection: Coitus interruptus  Other Topics Concern   Not on file  Social History Narrative   Not on file   Social Drivers of Health   Financial Resource Strain: Not on file  Food Insecurity: Not on file  Transportation Needs: Not on file  Physical Activity: Not on file  Stress: Not on file  Social Connections: Not on file  Intimate Partner Violence: Not on file    No current outpatient medications on file.     ROS:  Review of Systems  Constitutional:  Negative for fatigue, fever and unexpected weight change.  Respiratory:  Negative for cough, shortness of breath and wheezing.   Cardiovascular:  Negative for chest pain, palpitations and leg swelling.  Gastrointestinal:  Negative for blood in stool, constipation, diarrhea, nausea and vomiting.  Endocrine: Negative for cold intolerance, heat intolerance and polyuria.  Genitourinary:  Negative for dyspareunia, dysuria, flank pain, frequency, genital sores, hematuria, menstrual problem, pelvic pain, urgency, vaginal bleeding, vaginal discharge and vaginal pain.  Musculoskeletal:  Negative  for back pain, joint swelling and myalgias.  Skin:  Negative for rash.  Neurological:  Negative for dizziness, syncope, light-headedness, numbness and headaches.  Hematological:  Negative for adenopathy.  Psychiatric/Behavioral:  Negative for agitation, confusion, sleep disturbance and suicidal ideas. The patient is not nervous/anxious.   BREAST: No symptoms   Objective: There were no vitals taken for this visit.   Physical Exam Constitutional:      Appearance: She is well-developed.  Genitourinary:     Vulva normal.     Right Labia: No rash, tenderness or lesions.    Left Labia: No tenderness, lesions or rash.    No vaginal discharge, erythema or tenderness.      Right Adnexa: not tender and no mass  present.    Left Adnexa: not tender and no mass present.    No cervical friability or polyp.     Uterus is not enlarged or tender.  Breasts:    Right: No mass, nipple discharge, skin change or tenderness.     Left: No mass, nipple discharge, skin change or tenderness.  Neck:     Thyroid : No thyromegaly.  Cardiovascular:     Rate and Rhythm: Normal rate and regular rhythm.     Heart sounds: Normal heart sounds. No murmur heard. Pulmonary:     Effort: Pulmonary effort is normal.     Breath sounds: Normal breath sounds.  Abdominal:     Palpations: Abdomen is soft.     Tenderness: There is no abdominal tenderness. There is no guarding or rebound.  Musculoskeletal:        General: Normal range of motion.     Cervical back: Normal range of motion.  Lymphadenopathy:     Cervical: No cervical adenopathy.  Neurological:     General: No focal deficit present.     Mental Status: She is alert and oriented to person, place, and time.     Cranial Nerves: No cranial nerve deficit.  Skin:    General: Skin is warm and dry.  Psychiatric:        Mood and Affect: Mood normal.        Behavior: Behavior normal.        Thought Content: Thought content normal.        Judgment: Judgment normal.  Vitals reviewed.     Assessment/Plan: Encounter for annual routine gynecological examination  Encounter for screening mammogram for malignant neoplasm of breast - Plan: MM 3D SCREEN BREAST BILATERAL; pt to schedule due to FH breast cancer  Family history of breast cancer - Plan: MM 3D SCREEN BREAST BILATERAL, Integrated [facility] ([facility]); MyRisk testing discussed and done today. Will f/u with results.    GYN counsel breast self exam, mammography screening, adequate intake of calcium and vitamin D, diet and exercise     F/U  No follow-ups on file.  Jeanice Dempsey B. Treyce Spillers, PA-C 03/16/2024 5:27 PM

## 2024-03-17 ENCOUNTER — Encounter: Payer: Self-pay | Admitting: Obstetrics and Gynecology

## 2024-03-17 ENCOUNTER — Ambulatory Visit (INDEPENDENT_AMBULATORY_CARE_PROVIDER_SITE_OTHER): Payer: Self-pay | Admitting: Obstetrics and Gynecology

## 2024-03-17 ENCOUNTER — Other Ambulatory Visit (HOSPITAL_COMMUNITY)
Admission: RE | Admit: 2024-03-17 | Discharge: 2024-03-17 | Disposition: A | Discharge status: Home / Self Care | Payer: Self-pay | Source: Ambulatory Visit | Attending: Obstetrics and Gynecology | Admitting: Obstetrics and Gynecology

## 2024-03-17 VITALS — BP 113/76 | HR 75 | Ht 66.0 in | Wt 153.0 lb

## 2024-03-17 DIAGNOSIS — Z1231 Encounter for screening mammogram for malignant neoplasm of breast: Secondary | ICD-10-CM

## 2024-03-17 DIAGNOSIS — Z1151 Encounter for screening for human papillomavirus (HPV): Secondary | ICD-10-CM | POA: Insufficient documentation

## 2024-03-17 DIAGNOSIS — R928 Other abnormal and inconclusive findings on diagnostic imaging of breast: Secondary | ICD-10-CM

## 2024-03-17 DIAGNOSIS — Z9189 Other specified personal risk factors, not elsewhere classified: Secondary | ICD-10-CM

## 2024-03-17 DIAGNOSIS — Z124 Encounter for screening for malignant neoplasm of cervix: Secondary | ICD-10-CM | POA: Insufficient documentation

## 2024-03-17 DIAGNOSIS — Z0000 Encounter for general adult medical examination without abnormal findings: Secondary | ICD-10-CM

## 2024-03-17 DIAGNOSIS — Z803 Family history of malignant neoplasm of breast: Secondary | ICD-10-CM

## 2024-03-17 DIAGNOSIS — Z13220 Encounter for screening for lipoid disorders: Secondary | ICD-10-CM

## 2024-03-17 DIAGNOSIS — Z01419 Encounter for gynecological examination (general) (routine) without abnormal findings: Secondary | ICD-10-CM

## 2024-03-17 NOTE — Patient Instructions (Signed)
 I value your feedback and you entrusting [place] with your care. If you get a [facility] patient survey, I would appreciate you taking the time to let us know about your experience today. Thank you!  [facility] ([place])--[phone]

## 2024-03-18 LAB — CYTOLOGY - PAP
Comment: NEGATIVE
Diagnosis: NEGATIVE
High risk HPV: NEGATIVE

## 2024-03-20 ENCOUNTER — Ambulatory Visit
Admission: RE | Admit: 2024-03-20 | Discharge: 2024-03-20 | Disposition: A | Discharge status: Home / Self Care | Payer: Self-pay | Source: Ambulatory Visit | Attending: Obstetrics and Gynecology | Admitting: Obstetrics and Gynecology

## 2024-03-20 DIAGNOSIS — Z9189 Other specified personal risk factors, not elsewhere classified: Secondary | ICD-10-CM | POA: Insufficient documentation

## 2024-03-20 DIAGNOSIS — R928 Other abnormal and inconclusive findings on diagnostic imaging of breast: Secondary | ICD-10-CM | POA: Insufficient documentation

## 2024-03-20 DIAGNOSIS — Z803 Family history of malignant neoplasm of breast: Secondary | ICD-10-CM | POA: Insufficient documentation

## 2024-03-20 DIAGNOSIS — Z1231 Encounter for screening mammogram for malignant neoplasm of breast: Secondary | ICD-10-CM | POA: Insufficient documentation

## 2024-03-22 ENCOUNTER — Ambulatory Visit: Payer: Self-pay | Admitting: Obstetrics and Gynecology
